# Patient Record
Sex: Female | Born: 1970 | Race: White | Hispanic: No | Marital: Single
Health system: Western US, Academic
[De-identification: ages and names within clinical notes are randomized; demographics above are authoritative.]

## PROBLEM LIST (undated history)

## (undated) DIAGNOSIS — Z973 Presence of spectacles and contact lenses: Secondary | ICD-10-CM

## (undated) DIAGNOSIS — B379 Candidiasis, unspecified: Secondary | ICD-10-CM

## (undated) HISTORY — PX: HX GALL BLADDER SURGERY/CHOLE: SHX55

## (undated) HISTORY — PX: HX WISDOM TEETH EXTRACTION: SHX21

---

## 2020-04-21 ENCOUNTER — Telehealth (HOSPITAL_BASED_OUTPATIENT_CLINIC_OR_DEPARTMENT_OTHER): Payer: Self-pay

## 2020-04-21 NOTE — Telephone Encounter (Signed)
[facility] Call Center - External Referral - New Patient Appointment Request:    Incoming call received from: Patient      Patient is being referred to: GYN Onc / Dr. Lajoyce Corners     Dx:     Referred by: Self Referral     Referring MD Phone:     Registration Checklist Completed: Yes     Advised to have medical records faxed to: [phone] / U/S report received on Friday 10/1     Best callback number: [phone]    Ok to leave a detailed message: Yes     Pt only faxed over u/s results. Per pt has no Dx, has not had a Bx. Pt has no other records. Please follow up. Thank you.     Inform caller:    "Once the referral has been triaged by our clinical team, the provider's administrative assistant will contact the patient to offer an appointment."    "The Administrative Assistant will make a follow-up call if additional records, referral, or authorization are needed."    "The expected turnaround time is: 1-3 business days."

## 2020-04-23 NOTE — Telephone Encounter (Signed)
Looking to establish care with general gynecology referred to [facility] for assistance.

## 2020-04-23 NOTE — Telephone Encounter (Signed)
Out going placed, left message need all supporting medical records to review.

## 2020-04-26 ENCOUNTER — Telehealth (INDEPENDENT_AMBULATORY_CARE_PROVIDER_SITE_OTHER): Payer: Self-pay | Admitting: Ob/Gyn

## 2020-04-26 NOTE — Telephone Encounter (Signed)
I do not see her on my schedule.    2 no shows and she is excused from the practice, as she is not an established patient.    She lost the opportunity.  Other people are waiting in line.

## 2020-04-26 NOTE — Telephone Encounter (Addendum)
Sorry Dr. Kristopher Glee this was sent in error

## 2020-04-27 NOTE — Telephone Encounter (Signed)
I called and LMOm for Savvy Peeters to call and schedule.

## 2020-06-02 ENCOUNTER — Ambulatory Visit (HOSPITAL_COMMUNITY)
Admission: RE | Admit: 2020-06-02 | Discharge: 2020-06-02 | Disposition: A | Discharge status: Home / Self Care | Payer: Self-pay | Source: Ambulatory Visit

## 2022-09-18 ENCOUNTER — Encounter (HOSPITAL_BASED_OUTPATIENT_CLINIC_OR_DEPARTMENT_OTHER): Payer: Self-pay

## 2022-09-18 ENCOUNTER — Emergency Department
Admission: EM | Admit: 2022-09-18 | Discharge: 2022-09-18 | Disposition: A | Discharge status: Home / Self Care | Payer: BLUE CROSS/BLUE SHIELD | Attending: Student in an Organized Health Care Education/Training Program | Admitting: Student in an Organized Health Care Education/Training Program

## 2022-09-18 ENCOUNTER — Other Ambulatory Visit: Payer: Self-pay

## 2022-09-18 DIAGNOSIS — R300 Dysuria: Secondary | ICD-10-CM | POA: Insufficient documentation

## 2022-09-18 DIAGNOSIS — R1030 Lower abdominal pain, unspecified: Secondary | ICD-10-CM

## 2022-09-18 HISTORY — DX: Presence of spectacles and contact lenses: Z97.3

## 2022-09-18 LAB — CBC WITH DIFF
BASOPHIL #: 0.06 10*3/uL (ref 0.00–0.30)
BASOPHIL %: 1 % (ref 0–3)
EOSINOPHIL #: 0.32 10*3/uL (ref 0.00–0.80)
EOSINOPHIL %: 3 % (ref 0–7)
HCT: 43 % (ref 37.0–47.0)
HGB: 15.4 g/dL (ref 12.5–16.0)
LYMPHOCYTE #: 3.64 10*3/uL (ref 1.10–5.00)
LYMPHOCYTE %: 33 % (ref 25–45)
MCH: 33.4 pg — ABNORMAL HIGH (ref 27.0–32.0)
MCHC: 35.8 g/dL (ref 32.0–36.0)
MCV: 93.3 fL (ref 78.0–99.0)
MONOCYTE #: 0.65 10*3/uL (ref 0.00–1.30)
MONOCYTE %: 6 % (ref 0–12)
MPV: 8.8 fL (ref 7.4–10.4)
NEUTROPHIL #: 6.36 10*3/uL (ref 1.80–8.40)
NEUTROPHIL %: 58 % (ref 40–76)
PLATELETS: 228 10*3/uL (ref 140–440)
RBC: 4.61 10*6/uL (ref 4.20–5.40)
RDW: 14.7 % (ref 11.6–14.8)
WBC: 11 10*3/uL — ABNORMAL HIGH (ref 4.0–10.5)

## 2022-09-18 LAB — URINALYSIS, MICROSCOPIC: BACTERIA: NEGATIVE /hpf

## 2022-09-18 LAB — URINALYSIS, MACRO/MICRO
BILIRUBIN: NEGATIVE mg/dL
GLUCOSE: NEGATIVE mg/dL
KETONES: NEGATIVE mg/dL
LEUKOCYTES: NEGATIVE WBCs/uL
NITRITE: NEGATIVE
PH: 6 (ref 4.6–8.0)
PROTEIN: NEGATIVE mg/dL
SPECIFIC GRAVITY: 1.01 (ref 1.003–1.035)
UROBILINOGEN: 0.2 mg/dL (ref 0.2–1.0)

## 2022-09-18 LAB — BASIC METABOLIC PANEL
ANION GAP: 7 mmol/L (ref 4–13)
BUN/CREA RATIO: 14
BUN: 12 mg/dL (ref 7–18)
CALCIUM: 9.3 mg/dL (ref 8.5–10.1)
CHLORIDE: 105 mmol/L (ref 98–107)
CO2 TOTAL: 27 mmol/L (ref 21–32)
CREATININE: 0.87 mg/dL (ref 0.55–1.02)
ESTIMATED GFR: 81 mL/min/{1.73_m2} (ref >59)
GLUCOSE: 88 mg/dL (ref 74–106)
OSMOLALITY, CALCULATED: 277 mOsm/kg (ref 270–290)
POTASSIUM: 3.9 mmol/L (ref 3.5–5.1)
SODIUM: 139 mmol/L (ref 136–145)

## 2022-09-18 NOTE — Discharge Instructions (Signed)
Please check your labs on MyChart. You will be informed of any concerning results. Follow with you primary care and gynecology idally within the next 2-3 days. Return to the emergency department if you what uncontrollable fevers, back pain, or any other concerning symptoms.

## 2022-09-18 NOTE — ED Triage Notes (Signed)
Patient states started 8 days ago she went to [place] on Thursday and was told if it did not get better to come here.  Lower abd pain.

## 2022-09-18 NOTE — ED Provider Notes (Signed)
[facility], [employer] Emergency Department    Name: Jocelyn Le  Age and Gender: 52 y.o. female  PCP: No Pcp    Triage Note:   Abdominal Pain      HPI:  Jocelyn Le is a 52 y.o. female  who presents to the ED today for abdominal pain, urinary discomfort.  Reports about 1 week of worsening burning discomfort with urination as well as burning lower abdominal pain. No associated fever or chills. No nausea or vomiting, no diarrhea. Has not had any CP or SOB. Patient is still eating/drinking. Reports that she has been perimenopausal for several months, most recent period was back in the beginning of February. Has not followed with Ob/Gyn recently. No recent sexual activity.     History Limitations: None         ROS:  Pertinent ROS noted within HPI.      PHYSICAL EXAM:  Objective:  Nursing notes reviewed  ED Triage Vitals [09/18/22 1802]   BP (Non-Invasive) (!) 159/86   Heart Rate 72   Respiratory Rate 18   Temperature 37.3 C (99.1 F)   SpO2 100 %   Weight 81.6 kg (180 lb)   Height 1.651 m ('5\' 5"'$ )     Filed Vitals:    09/18/22 1802   BP: (!) 159/86   Pulse: 72   Resp: 18   Temp: 37.3 C (99.1 F)   SpO2: 100%       Pertinent Physical Exam:  Constitutional: 52 y.o. female appears stated age in good health. Sitting up in bed in no acute distress.  HENT: Normocephalic, atraumatic.  Neck: Supple, trachea midline.  Cardiovascular: Regular rate and regular rhythm.   Pulmonary/Chest: Normal work of breathing, no distress.   Abdominal: Soft, non-distended. Non-tender to palpation. No rebound or guarding.  GU: Deferred  Musculoskeletal: Normal ROM. No lower extremity edema.  Skin: Warm and dry. No rashes, erythema or cyanosis.  Psychiatric: Normal mood and affect.  Neurological: Alert and oriented. Conversing appropriately. Moving all extremities equally and fully with normal ROM. Sensation intact.    MEDICAL DECISION MAKING    Course and MDM:  Patient seen and examined. Labs and imaging  reviewed.  Jocelyn Le is a 51 y.o. female presenting for urinary discomfort, burning abd pain.    Reports 1 weeks of burning discomfort with urination and lower abd pain. States perimenopausal, most recent period beginning of February. Not sexually active recently. No lesions, rashes or other complaints.  On arrival, non-toxic appearing. Exam largely benign. GU exam initially deferred. UA, basic labs ordered.  Pending above, will transfer care to oncoming physician at this time.    Medical Decision Making  Amount and/or Complexity of Data Reviewed  Labs: ordered.            Disposition: Likely discharge      Clinical Impression:     Clinical Impression   Dysuria (Primary)            /Francess Mullen "Lorri Frederick, MD 09/18/2022, 18:44   Department of Emergency Medicine  [facility]      *Parts of this patients chart were completed in a retrospective fashion due to simultaneous direct patient care activities in the Emergency Department.   *This note was partially generated using MModal Fluency Direct system, and there may be some incorrect words, spellings, and punctuation that were not noted in checking the note before saving.

## 2022-09-18 NOTE — ED Attending Handoff Note (Signed)
MDM:    ED Course as of 09/18/22 2018   Mon Sep 18, 2022   2017 Patient room on my re-evaluation.  She would like to leave without labs.  He states that she urinated and had a bit of blood in it he thinks she might be starting her menstruation.  She also reports that she has been self medicating with clindamycin for 5 days.  As a shared decision patient will follow with primary care and will further gynecology.  Will inform her of any concerning labs.  I gave strict return to ED instructions both verbal and written manner      Discharged  Clinical Impression   Dysuria (Primary)           Current Discharge Medication List        CONTINUE these medications - NO CHANGES were made during your visit.        Details   cholecalciferol (vitamin D3) 250 mcg (10,000 unit) Capsule   2,000 Units, Oral, DAILY  Refills: 0     multivitamin with iron Tablet   1 Tablet, Oral, DAILY  Refills: 0

## 2022-09-18 NOTE — ED Nurses Note (Signed)
Pt not in room at time of discharge.

## 2022-09-27 ENCOUNTER — Emergency Department
Admission: EM | Admit: 2022-09-27 | Discharge: 2022-09-27 | Disposition: A | Discharge status: Home / Self Care | Payer: BLUE CROSS/BLUE SHIELD | Attending: Physician Assistant | Admitting: Physician Assistant

## 2022-09-27 ENCOUNTER — Encounter (HOSPITAL_BASED_OUTPATIENT_CLINIC_OR_DEPARTMENT_OTHER): Payer: Self-pay

## 2022-09-27 ENCOUNTER — Other Ambulatory Visit: Payer: Self-pay

## 2022-09-27 DIAGNOSIS — R509 Fever, unspecified: Secondary | ICD-10-CM | POA: Insufficient documentation

## 2022-09-27 DIAGNOSIS — Z3200 Encounter for pregnancy test, result unknown: Secondary | ICD-10-CM | POA: Insufficient documentation

## 2022-09-27 DIAGNOSIS — R0982 Postnasal drip: Secondary | ICD-10-CM | POA: Insufficient documentation

## 2022-09-27 DIAGNOSIS — R682 Dry mouth, unspecified: Secondary | ICD-10-CM | POA: Insufficient documentation

## 2022-09-27 DIAGNOSIS — R102 Pelvic and perineal pain: Secondary | ICD-10-CM

## 2022-09-27 DIAGNOSIS — Z1152 Encounter for screening for COVID-19: Secondary | ICD-10-CM | POA: Insufficient documentation

## 2022-09-27 LAB — CBC WITH DIFF
BASOPHIL #: 0.04 10*3/uL (ref 0.00–0.30)
BASOPHIL %: 0 % (ref 0–3)
EOSINOPHIL #: 0.24 10*3/uL (ref 0.00–0.80)
EOSINOPHIL %: 2 % (ref 0–7)
HCT: 46 % (ref 37.0–47.0)
HGB: 15.7 g/dL (ref 12.5–16.0)
LYMPHOCYTE #: 2.86 10*3/uL (ref 1.10–5.00)
LYMPHOCYTE %: 25 % (ref 25–45)
MCH: 32.1 pg — ABNORMAL HIGH (ref 27.0–32.0)
MCHC: 34 g/dL (ref 32.0–36.0)
MCV: 94.2 fL (ref 78.0–99.0)
MONOCYTE #: 0.61 10*3/uL (ref 0.00–1.30)
MONOCYTE %: 5 % (ref 0–12)
MPV: 8.3 fL (ref 7.4–10.4)
NEUTROPHIL #: 7.8 10*3/uL (ref 1.80–8.40)
NEUTROPHIL %: 68 % (ref 40–76)
PLATELETS: 197 10*3/uL (ref 140–440)
RBC: 4.88 10*6/uL (ref 4.20–5.40)
RDW: 14.8 % (ref 11.6–14.8)
WBC: 11.6 10*3/uL — ABNORMAL HIGH (ref 4.0–10.5)

## 2022-09-27 LAB — COMPREHENSIVE METABOLIC PANEL, NON-FASTING
ALBUMIN/GLOBULIN RATIO: 1 (ref 0.8–1.4)
ALBUMIN: 3.7 g/dL (ref 3.4–5.0)
ALKALINE PHOSPHATASE: 64 U/L (ref 46–116)
ALT (SGPT): 16 U/L (ref <=78)
ANION GAP: 10 mmol/L (ref 4–13)
AST (SGOT): 20 U/L (ref 15–37)
BILIRUBIN TOTAL: 0.8 mg/dL (ref 0.2–1.0)
BUN/CREA RATIO: 15
BUN: 12 mg/dL (ref 7–18)
CALCIUM, CORRECTED: 9.4 mg/dL
CALCIUM: 9.2 mg/dL (ref 8.5–10.1)
CHLORIDE: 104 mmol/L (ref 98–107)
CO2 TOTAL: 25 mmol/L (ref 21–32)
CREATININE: 0.81 mg/dL (ref 0.55–1.02)
ESTIMATED GFR: 88 mL/min/{1.73_m2} (ref >59)
GLOBULIN: 3.6
GLUCOSE: 87 mg/dL (ref 74–106)
OSMOLALITY, CALCULATED: 277 mOsm/kg (ref 270–290)
POTASSIUM: 4.1 mmol/L (ref 3.5–5.1)
PROTEIN TOTAL: 7.3 g/dL (ref 6.4–8.2)
SODIUM: 139 mmol/L (ref 136–145)

## 2022-09-27 LAB — URINALYSIS, MACRO/MICRO
BILIRUBIN: NEGATIVE mg/dL
BLOOD: NEGATIVE mg/dL
GLUCOSE: NEGATIVE mg/dL
KETONES: NEGATIVE mg/dL
LEUKOCYTES: NEGATIVE WBCs/uL
NITRITE: NEGATIVE
PH: 7 (ref 4.6–8.0)
PROTEIN: NEGATIVE mg/dL
SPECIFIC GRAVITY: 1.015 (ref 1.003–1.035)
UROBILINOGEN: 0.2 mg/dL (ref 0.2–1.0)

## 2022-09-27 LAB — COVID-19, FLU A/B, RSV RAPID BY PCR
INFLUENZA VIRUS TYPE A: NOT DETECTED
INFLUENZA VIRUS TYPE B: NOT DETECTED
RESPIRATORY SYNCTIAL VIRUS (RSV): NOT DETECTED
SARS-CoV-2: NOT DETECTED

## 2022-09-27 LAB — WETMOUNT
CLUE CELLS: ABSENT
TRICHOMONAS: ABSENT
YEAST: ABSENT

## 2022-09-27 LAB — LACTIC ACID LEVEL W/ REFLEX FOR LEVEL >2.0: LACTIC ACID: 0.7 mmol/L (ref 0.4–2.0)

## 2022-09-27 LAB — HCG QUALITATIVE PREGNANCY, SERUM: PREGNANCY, SERUM QUALITATIVE: NEGATIVE

## 2022-09-27 NOTE — ED Provider Notes (Signed)
Emergency Medicine      Name: Jocelyn Le  Age and Gender: 52 y.o. female  Date of Birth: [birth year]  MRN: [id number]  PCP: No Pcp    CC:  Chief Complaint   Patient presents with    Abdominal Pain       HPI:  Jocelyn Le is a 52 y.o. White female who presents to the ER with vaginal and pelvic burning for the past 3 weeks. She says she has been having associated lower abdominal discomfort. She says she had a full course of Clindamycin at her home which she took in attempt to relieve her symptoms, which did not help. She then reportedly tried a 3 day course of monistat, which did not change her symptoms but made her urine "bubbly". She denies sexual activity since 2018. She also notes dry mouth and feels her cervical lymph nodes are swollen, and endorses chronic postnasal drainage. Patient states she is perimenopausal and had a prolonged menses last month. She was seen in the ER last Monday but started having some vaginal spotting and so assumed that was what was causing her symptoms and was discharged. She returned to the ER today after she developed a fever of 99 at home this morning. She did make an appointment with Dr Theodosia Quay, which isn't until next month.    Below pertinent information reviewed with patient:  Past Medical History:   Diagnosis Date    Wears glasses          No Known Allergies    Past Surgical History:   Procedure Laterality Date    HX CHOLECYSTECTOMY            Social History     Socioeconomic History    Marital status: Single   Tobacco Use    Smoking status: Former     Types: Cigarettes    Smokeless tobacco: Never   Vaping Use    Vaping status: Never Used   Substance and Sexual Activity    Alcohol use: Yes     Comment: rarely    Drug use: Never       ROS:  No other overt positive review of systems are noted other than stated in the HPI.      Objective:    ED Triage Vitals [09/27/22 1448]   BP (Non-Invasive) (!) 163/89   Heart Rate 82   Respiratory Rate 17   Temperature 37.8 C (100.1 F)    SpO2 99 %   Weight 81.6 kg (180 lb)   Height 1.651 m ('5\' 5"'$ )     Filed Vitals:    09/27/22 1448 09/27/22 1717   BP: (!) 163/89 137/86   Pulse: 82 77   Resp: 17 14   Temp: 37.8 C (100.1 F) 36.9 C (98.5 F)   SpO2: 99% 99%       Nursing notes and vital signs reviewed.    Constitutional - No acute distress.  Alert and Active.  HEENT - Normocephalic. Conjunctiva clear. Moist mucous membranes.   Neck - Trachea midline. No stridor. No hoarseness.  Cardiac - Regular rate and rhythm. No murmurs, rubs, or gallops.   Respiratory/Chest - Normal respiratory effort. Clear to auscultation bilaterally. No rales, wheezes or rhonchi.   Abdomen - Normal bowel sounds. Suprapubic tenderness, soft, non-distended. No rebound or guarding. No CVA tenderness.  GU - normal external appearance of the vagina, with a small amount of thin white vaginal discharge. No CMT or tenderness of the adnexa.  Musculoskeletal -  Good AROM. No clubbing, cyanosis or edema.  Skin - Warm and dry, without any rashes or other lesions.  Neuro - Alert and oriented x 3. Moving all extremities symmetrically.   Psych - Normal mood and affect. Behavior is normal       Any pertinent labs and imaging obtained during this encounter reviewed below in MDM.    MDM/ED Course:      Medical Decision Making  Patient presented to the ER with complaints of vaginal pain and burning as well as lower abdominal discomfort which has been present for the past 3 weeks.  Patient reported that she is perimenopausal with last menstrual cycle in February as well as a small amount of vaginal spotting last Monday.  She was seen in the ER last Monday, was evaluated and discharged with instructions to follow up with Gynecology.  She has an upcoming appointment with Dr. Theodosia Quay next month.  She says she developed a subjective fever over the weekend with temp up to 99 at home this morning so she came back to the ER for evaluation.  Differential diagnoses include UTI, PID, acute viral  syndrome.  Patient underwent diagnostics with results as noted in ED course.  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  Although the patient is requesting antibiotics, I do not feel they are currently indicated and may cause additional difficulty in determining the clear etiology of the patient's symptoms.  For this reason, I will discharge her in stable condition with instructions to take Tylenol/Motrin as needed for pain or fever.  Patient will be given additional referrals for gynecology.    Amount and/or Complexity of Data Reviewed  Labs: ordered. Decision-making details documented in ED Course.  ECG/medicine tests: independent interpretation performed.        ED Course as of 09/27/22 1818   Wed Sep 27, 2022   1719 COMPREHENSIVE METABOLIC PANEL, NON-FASTING  normal   1719 LACTIC ACID: 0.7   1719 PREGNANCY, SERUM QUALITATIVE: Negative   1719 CBC/DIFF(!)  Wbc 11.6, similar to labs from last week, normal H&H, plts and PMNs   1720 SARS CORONAVIRUS 2 (SARS-CoV-2): Not Detected   1720 INFLUENZA VIRUS TYPE A: Not Detected   1720 INFLUENZA VIRUS TYPE B: Not Detected   1720 RSV PCR: Not Detected   1720 URINALYSIS WITH REFLEX MICROSCOPIC AND CULTURE IF POSITIVE(!)  normal   1722 TRICHOMONAS: Absent   1722 YEAST: Absent   1722 CLUE CELLS: Absent       Orders Placed This Encounter    [facility] BLOOD CULTURE, SET OF 2 ADULT BOTTLES (BACTERIA AND YEAST)    ADULT ROUTINE BLOOD CULTURE, SET OF 2 ADULT BOTTLES (BACTERIA AND YEAST)    URINE CULTURE    URINALYSIS WITH REFLEX MICROSCOPIC AND CULTURE IF POSITIVE    URINALYSIS, MACRO/MICRO    CBC/DIFF    COMPREHENSIVE METABOLIC PANEL, NON-FASTING    LACTIC ACID LEVEL W/ REFLEX FOR LEVEL >2.0    HCG QUALITATIVE PREGNANCY, SERUM    CHLAMYDIA / NEISSERIA DNA BY PCR    COVID-19, FLU A/B, RSV RAPID BY PCR    CBC WITH DIFF         Impression:   Clinical Impression   Vulvovaginal pain (Primary)       Disposition: Discharged    / Elige Ko PA-C   09/27/2022, 15:53  [facility]  Department of Emergency Medicine  [facility]    Portions of this  note may have been dictated using voice recognition software.     -----------------------  Results for orders placed or performed during the hospital encounter of 09/27/22 (from the past 12 hour(s))   URINALYSIS, MACRO/MICRO   Result Value Ref Range    COLOR Light Yellow (A) Yellow    APPEARANCE Clear Clear    SPECIFIC GRAVITY 1.015 1.003 - 1.035    PH 7.0 4.6 - 8.0    LEUKOCYTES Negative Negative WBCs/uL    NITRITE Negative Negative    PROTEIN Negative Negative mg/dL    GLUCOSE Negative Negative mg/dL    KETONES Negative Negative mg/dL    BILIRUBIN Negative Negative mg/dL    BLOOD Negative Negative mg/dL    UROBILINOGEN 0.2 0.2 - 1.0 mg/dL   COVID-19, FLU A/B, RSV RAPID BY PCR   Result Value Ref Range    SARS-CoV-2 Not Detected Not Detected    INFLUENZA VIRUS TYPE A Not Detected Not Detected    INFLUENZA VIRUS TYPE B Not Detected Not Detected    RESPIRATORY SYNCTIAL VIRUS (RSV) Not Detected Not Detected   COMPREHENSIVE METABOLIC PANEL, NON-FASTING   Result Value Ref Range    SODIUM 139 136 - 145 mmol/L    POTASSIUM 4.1 3.5 - 5.1 mmol/L    CHLORIDE 104 98 - 107 mmol/L    CO2 TOTAL 25 21 - 32 mmol/L    ANION GAP 10 4 - 13 mmol/L    BUN 12 7 - 18 mg/dL    CREATININE 0.81 0.55 - 1.02 mg/dL    BUN/CREA RATIO 15     ESTIMATED GFR 88 >59 mL/min/1.9m2    ALBUMIN 3.7 3.4 - 5.0 g/dL    CALCIUM 9.2 8.5 - 10.1 mg/dL    GLUCOSE 87 74 - 106 mg/dL    ALKALINE PHOSPHATASE 64 46 - 116 U/L    ALT (SGPT) 16 <=78 U/L    AST (SGOT) 20 15 - 37 U/L    BILIRUBIN TOTAL 0.8 0.2 - 1.0 mg/dL    PROTEIN TOTAL 7.3 6.4 - 8.2 g/dL    ALBUMIN/GLOBULIN RATIO 1.0 0.8 - 1.4    OSMOLALITY, CALCULATED 277 270 - 290 mOsm/kg    CALCIUM, CORRECTED 9.4 mg/dL    GLOBULIN 3.6    LACTIC ACID LEVEL W/ REFLEX FOR LEVEL >2.0   Result Value Ref Range    LACTIC ACID 0.7 0.4 - 2.0 mmol/L   HCG QUALITATIVE PREGNANCY, SERUM   Result Value  Ref Range    PREGNANCY, SERUM QUALITATIVE Negative Negative, Indeterminate   CBC WITH DIFF   Result Value Ref Range    WBC 11.6 (H) 4.0 - 10.5 x10^3/uL    RBC 4.88 4.20 - 5.40 x10^6/uL    HGB 15.7 12.5 - 16.0 g/dL    HCT 46.0 37.0 - 47.0 %    MCV 94.2 78.0 - 99.0 fL    MCH 32.1 (H) 27.0 - 32.0 pg    MCHC 34.0 32.0 - 36.0 g/dL    RDW 14.8 11.6 - 14.8 %    PLATELETS 197 140 - 440 x10^3/uL    MPV 8.3 7.4 - 10.4 fL    NEUTROPHIL % 68 40 - 76 %    LYMPHOCYTE % 25 25 - 45 %    MONOCYTE % 5 0 - 12 %    EOSINOPHIL % 2 0 - 7 %    BASOPHIL % 0 0 - 3 %    NEUTROPHIL # 7.80 1.80 - 8.40 x10^3/uL    LYMPHOCYTE #  2.86 1.10 - 5.00 x10^3/uL    MONOCYTE # 0.61 0.00 - 1.30 x10^3/uL    EOSINOPHIL # 0.24 0.00 - 0.80 x10^3/uL    BASOPHIL # 0.04 0.00 - 0.30 x10^3/uL   WETMOUNT    Specimen: Vaginal; Other   Result Value Ref Range    TRICHOMONAS Absent Absent    YEAST Absent Absent    CLUE CELLS Absent Absent     No orders to display

## 2022-09-27 NOTE — ED Triage Notes (Signed)
Patient states started about 3 weeks ago lower abd pain.  Was here on the 4th and called GYN for appointment but cannot get in for month.  Lower abd pain burning, fever, swollen lymph nodes, and bubbly urine

## 2022-09-27 NOTE — ED Nurses Note (Signed)
Pt refused food tray that was brought to her, she kept water

## 2022-09-27 NOTE — Discharge Instructions (Signed)
Follow-up with the gyn of your choice for further evaluation and treatment.  Return to the ER for other emergencies or as needed

## 2022-09-27 NOTE — ED Nurses Note (Signed)
Patient discharged home with family.  AVS reviewed with patient/care giver.  A written copy of the AVS and discharge instructions was given to the patient/care giver.  Questions sufficiently answered as needed.  Patient/care giver encouraged to follow up with PCP as indicated.  In the event of an emergency, patient/care giver instructed to call 911 or go to the nearest emergency room.

## 2022-09-27 NOTE — ED Nurses Note (Signed)
Vaginal exam by K harmon

## 2022-09-28 LAB — CHLAMYDIA / NEISSERIA DNA BY PCR
CHLAMYDIA TRACHOMATIS PCR: NOT DETECTED
NEISSERIA GONORRHOEAE PCR: NOT DETECTED

## 2022-10-02 LAB — ADULT ROUTINE BLOOD CULTURE, SET OF 2 BOTTLES (BACTERIA AND YEAST)
BLOOD CULTURE, ROUTINE: NO GROWTH
BLOOD CULTURE, ROUTINE: NO GROWTH

## 2022-10-23 DIAGNOSIS — B3731 Acute candidiasis of vulva and vagina: Secondary | ICD-10-CM | POA: Insufficient documentation

## 2022-10-24 ENCOUNTER — Emergency Department (EMERGENCY_DEPARTMENT_HOSPITAL): Payer: BLUE CROSS/BLUE SHIELD

## 2022-10-24 ENCOUNTER — Emergency Department
Admission: EM | Admit: 2022-10-24 | Discharge: 2022-10-24 | Disposition: A | Discharge status: Home / Self Care | Payer: BLUE CROSS/BLUE SHIELD | Attending: Emergency Medicine | Admitting: Emergency Medicine

## 2022-10-24 ENCOUNTER — Encounter (HOSPITAL_BASED_OUTPATIENT_CLINIC_OR_DEPARTMENT_OTHER): Payer: Self-pay

## 2022-10-24 ENCOUNTER — Other Ambulatory Visit: Payer: Self-pay

## 2022-10-24 DIAGNOSIS — T378X5A Adverse effect of other specified systemic anti-infectives and antiparasitics, initial encounter: Secondary | ICD-10-CM | POA: Insufficient documentation

## 2022-10-24 DIAGNOSIS — I7090 Unspecified atherosclerosis: Secondary | ICD-10-CM | POA: Insufficient documentation

## 2022-10-24 DIAGNOSIS — Z87891 Personal history of nicotine dependence: Secondary | ICD-10-CM | POA: Insufficient documentation

## 2022-10-24 DIAGNOSIS — R531 Weakness: Secondary | ICD-10-CM

## 2022-10-24 DIAGNOSIS — T50905A Adverse effect of unspecified drugs, medicaments and biological substances, initial encounter: Secondary | ICD-10-CM

## 2022-10-24 DIAGNOSIS — I6523 Occlusion and stenosis of bilateral carotid arteries: Secondary | ICD-10-CM

## 2022-10-24 DIAGNOSIS — R202 Paresthesia of skin: Secondary | ICD-10-CM | POA: Insufficient documentation

## 2022-10-24 DIAGNOSIS — I4510 Unspecified right bundle-branch block: Secondary | ICD-10-CM | POA: Insufficient documentation

## 2022-10-24 LAB — COMPREHENSIVE METABOLIC PANEL, NON-FASTING
ALBUMIN/GLOBULIN RATIO: 1.1 (ref 0.8–1.4)
ALBUMIN: 4 g/dL (ref 3.4–5.0)
ALKALINE PHOSPHATASE: 63 U/L (ref 46–116)
ALT (SGPT): 12 U/L (ref <=78)
ANION GAP: 11 mmol/L (ref 4–13)
AST (SGOT): 15 U/L (ref 15–37)
BILIRUBIN TOTAL: 0.9 mg/dL (ref 0.2–1.0)
BUN/CREA RATIO: 15
BUN: 16 mg/dL (ref 7–18)
CALCIUM, CORRECTED: 9 mg/dL
CALCIUM: 9 mg/dL (ref 8.5–10.1)
CHLORIDE: 103 mmol/L (ref 98–107)
CO2 TOTAL: 26 mmol/L (ref 21–32)
CREATININE: 1.05 mg/dL — ABNORMAL HIGH (ref 0.55–1.02)
ESTIMATED GFR: 65 mL/min/{1.73_m2} (ref >59)
GLOBULIN: 3.8
GLUCOSE: 105 mg/dL (ref 74–106)
OSMOLALITY, CALCULATED: 281 mOsm/kg (ref 270–290)
POTASSIUM: 4.5 mmol/L (ref 3.5–5.1)
PROTEIN TOTAL: 7.8 g/dL (ref 6.4–8.2)
SODIUM: 140 mmol/L (ref 136–145)

## 2022-10-24 LAB — CBC WITH DIFF
BASOPHIL #: 0.04 10*3/uL (ref 0.00–0.30)
BASOPHIL %: 0 % (ref 0–3)
EOSINOPHIL #: 0.16 10*3/uL (ref 0.00–0.80)
EOSINOPHIL %: 2 % (ref 0–7)
HCT: 49 % — ABNORMAL HIGH (ref 37.0–47.0)
HGB: 16.9 g/dL — ABNORMAL HIGH (ref 12.5–16.0)
LYMPHOCYTE #: 2.32 10*3/uL (ref 1.10–5.00)
LYMPHOCYTE %: 23 % — ABNORMAL LOW (ref 25–45)
MCH: 32.3 pg — ABNORMAL HIGH (ref 27.0–32.0)
MCHC: 34.5 g/dL (ref 32.0–36.0)
MCV: 93.6 fL (ref 78.0–99.0)
MONOCYTE #: 0.44 10*3/uL (ref 0.00–1.30)
MONOCYTE %: 4 % (ref 0–12)
MPV: 8 fL (ref 7.4–10.4)
NEUTROPHIL #: 7.03 10*3/uL (ref 1.80–8.40)
NEUTROPHIL %: 70 % (ref 40–76)
PLATELETS: 199 10*3/uL (ref 140–440)
RBC: 5.24 10*6/uL (ref 4.20–5.40)
RDW: 14.6 % (ref 11.6–14.8)
WBC: 10 10*3/uL (ref 4.0–10.5)

## 2022-10-24 LAB — NT-PROBNP: NT-PROBNP: 67 pg/mL (ref <=125)

## 2022-10-24 LAB — POC BLOOD GLUCOSE (RESULTS): GLUCOSE, POC: 107 mg/dl — ABNORMAL HIGH (ref 70–100)

## 2022-10-24 LAB — PT/INR
INR: 1.06 (ref 0.88–1.10)
PROTHROMBIN TIME: 12.3 seconds (ref 9.8–12.7)

## 2022-10-24 LAB — TROPONIN-I: TROPONIN I: 9 ng/L (ref <15)

## 2022-10-24 LAB — PTT (PARTIAL THROMBOPLASTIN TIME): APTT: 37.7 seconds — ABNORMAL HIGH (ref 22.0–31.7)

## 2022-10-24 MED ORDER — IOHEXOL 350 MG IODINE/ML INTRAVENOUS SOLUTION
100.0000 mL | INTRAVENOUS | Status: AC
Start: 2022-10-24 — End: 2022-10-24
  Administered 2022-10-24: 100 mL via INTRAVENOUS

## 2022-10-24 NOTE — Discharge Instructions (Signed)
See your primary care doctor were discussed the case with them in the next week

## 2022-10-24 NOTE — ED Nurses Note (Signed)
Pt states, "I was at work typing when my I started feeling funny.  My head felt fuzzy. My left arm became heavy and tingling . Then my right arm started. Last night I had a head ache, but I didn't this morning. I recently started Taking Flagyl for a yeast infection. I have mold in my house and that makes the yeast worse. I'm afraid the mold may have went to my brain." Speech clear. Moves all extremities at will and to command. No weakness noted. Pupils equal and react brisk 3.0 mm. Nurse remains with pt until no longer crying.

## 2022-10-24 NOTE — ED Triage Notes (Signed)
Patient states started about 20 min ago dizzy and arms feel heavy the left more than right.  Head is tingly.

## 2022-10-24 NOTE — ED Provider Notes (Signed)
CHIEF COMPLAINT  Chief Complaint   Patient presents with    Dizziness     HISTORY OF PRESENT ILLNESS  Jocelyn Le, date of birth 1970/09/25, is a 52 y.o. female who presented to the Emergency Department    Patient states that approximately 9:15 a.m. she was sitting at her desk at onset of upper extremity weakness having his paresthesias left more so than right she also felt a sensation in her head like her brain shift different 1 side to the other.  She has not had sensation like this in the past.  She is concerned that she has a yeast infection her urine as per spread to the rest of her body.  These infection was diagnosed recently by OBGYN she is just started fluconazole yesterday for this infection.  Le chest pain nausea or vomiting.  Le lower extremity weakness Le fever chills    PAST MEDICAL/SURGICAL/FAMILY/SOCIAL HISTORY  Past Medical History:   Diagnosis Date    Wears glasses        Past Surgical History:   Procedure Laterality Date    HX CHOLECYSTECTOMY         Family Medical History:    None       Social History     Socioeconomic History    Marital status: Single   Tobacco Use    Smoking status: Former     Types: Cigarettes    Smokeless tobacco: Never   Vaping Use    Vaping status: Never Used   Substance and Sexual Activity    Alcohol use: Not Currently     Comment: rarely    Drug use: Never      ALLERGIES  Allergies   Allergen Reactions    Smallpox Vaccine,Chick-Embryo,Live  Other Adverse Reaction (Add comment)     Patient unsure which vaccine it was and does not know what happened.  States her mom said she about died.       PHYSICAL EXAM  VITAL SIGNS:  Filed Vitals:    10/24/22 0954   BP: (!) 163/95   Pulse: 80   Resp: 17   Temp: 37 C (98.6 F)   SpO2: 100%     GENERAL: PATIENT IS ALERT AND ORIENTED TO PERSON, PLACE, AND TIME.  IN Le DISTRESS  HEAD: NORMOCEPHALIC AND ATRAUMATIC.  EYES: PUPILS EQUALLY ROUND AND REACT TO LIGHT. EXTRAOCULAR MOVEMENTS INTACT.  EARS: GROSS HEARING INTACT. EXTERNAL  EARS WITHIN NORMAL LIMITS.  THROAT: MOIST ORAL MUCOSA. Le ERYTHEMA OR EXUDATE OF THE PHARYNX.  NECK: SUPPLE. TRACHEA MIDLINE.  Le LYMPHADENOPATHY  CARDIOVASCULAR: REGULAR, RATE, AND RHYTHM. Le MURMUR.  LUNGS: CLEAR TO AUSCULTATION BILATERAL.  ABDOMEN: SOFT, NON-TENDER, NON-DISTENDED, AND BOWEL SOUNDS ARE PRESENT.  GENITOURINARY: DEFERRED.  RECTAL: DEFERRED.  EXTREMITIES: Le CYANOSIS, CLUBBING, OR EDEMA.  Le GROSS DEFORMITIES, MOVES ALL 4 EXTREMITIES  SKIN: WARM AND DRY.  NEUROLOGIC: CRANIAL NERVES II THROUGH XII ARE GROSSLY INTACT ALTHOUGH NOT INDIVIDUALLY TESTED.  Le gross motor deficits  PSYCHIATRIC: JUDGMENT AND INSIGHT ARE SEEMINGLY INTACT. MOOD AND AFFECT ARE APPROPRIATE FOR THE SITUATION.    NIH Stroke Scale:  NIH Total Score: 0    NIH Stroke Scale/Score (NIHSS) from [email or web address]  on 10/24/2022  ** All calculations should be rechecked by clinician prior to use **    RESULT SUMMARY:  0 points  NIH Stroke Scale      INPUTS:  1A: Level of consciousness --> 0 = Alert; keenly responsive  1B: Ask month and age --> 0 = Both  questions right  1C: 'Blink eyes' & 'squeeze hands' --> 0 = Performs both tasks  2: Horizontal extraocular movements --> 0 = Normal  3: Visual fields --> 0 = Le visual loss  4: Facial palsy --> 0 = Normal symmetry  5A: Left arm motor drift --> 0 = Le drift for 10 seconds  5B: Right arm motor drift --> 0 = Le drift for 10 seconds  6A: Left leg motor drift --> 0 = Le drift for 5 seconds  6B: Right leg motor drift --> 0 = Le drift for 5 seconds  7: Limb Ataxia --> 0 = Le ataxia  8: Sensation --> 0 = Normal; Le sensory loss  9: Language/aphasia --> 0 = Normal; Le aphasia  10: Dysarthria --> 0 = Normal  11: Extinction/inattention --> 0 = Le abnormality           PROCEDURES    DIAGNOSTICS  Labs:  Labs listed below were reviewed and interpreted by me.  Results for orders placed or performed during the hospital encounter of 10/24/22   COMPREHENSIVE METABOLIC PANEL, NON-FASTING   Result Value Ref Range     SODIUM 140 136 - 145 mmol/L    POTASSIUM 4.5 3.5 - 5.1 mmol/L    CHLORIDE 103 98 - 107 mmol/L    CO2 TOTAL 26 21 - 32 mmol/L    ANION GAP 11 4 - 13 mmol/L    BUN 16 7 - 18 mg/dL    CREATININE 8.40 (H) 0.55 - 1.02 mg/dL    BUN/CREA RATIO 15     ESTIMATED GFR 65 >59 mL/min/1.7m^2    ALBUMIN 4.0 3.4 - 5.0 g/dL    CALCIUM 9.0 8.5 - 37.5 mg/dL    GLUCOSE 436 74 - 067 mg/dL    ALKALINE PHOSPHATASE 63 46 - 116 U/L    ALT (SGPT) 12 <=78 U/L    AST (SGOT) 15 15 - 37 U/L    BILIRUBIN TOTAL 0.9 0.2 - 1.0 mg/dL    PROTEIN TOTAL 7.8 6.4 - 8.2 g/dL    ALBUMIN/GLOBULIN RATIO 1.1 0.8 - 1.4    OSMOLALITY, CALCULATED 281 270 - 290 mOsm/kg    CALCIUM, CORRECTED 9.0 mg/dL    GLOBULIN 3.8    PT/INR   Result Value Ref Range    PROTHROMBIN TIME 12.3 9.8 - 12.7 seconds    INR 1.06 0.88 - 1.10   PTT (PARTIAL THROMBOPLASTIN TIME)   Result Value Ref Range    APTT 37.7 (H) 22.0 - 31.7 seconds   TROPONIN-I   Result Value Ref Range    TROPONIN I 9 <15 ng/L   CBC WITH DIFF   Result Value Ref Range    WBC 10.0 4.0 - 10.5 x10^3/uL    RBC 5.24 4.20 - 5.40 x10^6/uL    HGB 16.9 (H) 12.5 - 16.0 g/dL    HCT 70.3 (H) 40.3 - 47.0 %    MCV 93.6 78.0 - 99.0 fL    MCH 32.3 (H) 27.0 - 32.0 pg    MCHC 34.5 32.0 - 36.0 g/dL    RDW 52.4 81.8 - 59.0 %    PLATELETS 199 140 - 440 x10^3/uL    MPV 8.0 7.4 - 10.4 fL    NEUTROPHIL % 70 40 - 76 %    LYMPHOCYTE % 23 (L) 25 - 45 %    MONOCYTE % 4 0 - 12 %    EOSINOPHIL % 2 0 - 7 %    BASOPHIL % 0  0 - 3 %    NEUTROPHIL # 7.03 1.80 - 8.40 x10^3/uL    LYMPHOCYTE # 2.32 1.10 - 5.00 x10^3/uL    MONOCYTE # 0.44 0.00 - 1.30 x10^3/uL    EOSINOPHIL # 0.16 0.00 - 0.80 x10^3/uL    BASOPHIL # 0.04 0.00 - 0.30 x10^3/uL   NT-PROBNP   Result Value Ref Range    NT-PROBNP 67 <=125 pg/mL   ECG 12 LEAD   Result Value Ref Range    Ventricular rate 77 BPM    Atrial Rate 77 BPM    PR Interval 158 ms    QRS Duration 94 ms    QT Interval 376 ms    QTC Calculation 425 ms    Calculated P Axis 54 degrees    Calculated R Axis -25 degrees     Calculated T Axis 65 degrees   POC BLOOD GLUCOSE (RESULTS)   Result Value Ref Range    GLUCOSE, POC 107 (H) 70 - 100 mg/dl     Radiology:  Results for orders placed or performed during the hospital encounter of 10/24/22   CT STROKE PROTOCOL (CTA HEAD/NECK WO/W)     Status: None    Narrative    Jahniah Lader    RADIOLOGIST: Karolee Stamps    CT STROKE PROTOCOL (CTA HEAD/NECK WO/W) performed on 10/24/2022 10:39 AM    CLINICAL HISTORY: Suspected Stroke.  Suspected Stroke    TECHNIQUE:  Head CT without intravenous contrast followed by CTA imaging of the head and neck from the aortic arch to the skull vertex with intravenous contrast.  3D reconstructions.    CONTRAST: 100 ml's of Omnipaque 350    COMPARISON:  None.    FINDINGS:  CT Brain:  There is Le acute intracranial hemorrhage, mass effect, or evidence of large transcortical acute to subacute ischemic infarct.    Brain: Gray-white matter differentiation appears preserved. Cerebellar tonsils are in normal position.    CSF Spaces: The ventricular system, basilar cisterns and cortical sulci are normal in size, shape and position. Le abnormal hyperdense extra axial fluid collection is identified.     Sinuses/Mastoids:  Minimal maxillary sinus floor mucosal thickening of the right is present. The paranasal sinuses are otherwise clear. The mastoid air cells and middle ear cavities are well-aerated bilaterally.     Bones: Le acute osseous abnormality or osseous destructive lesion.      CTA:  Aortic Arch: Normal size and branching pattern.  Le significant atherosclerotic plaque.  Brachiocephalic and Subclavians:  Unremarkable    Right Carotid: Atherosclerotic changes are not seen.  Right CCA: Normal.  Right ICA: Normal.  Right ECA: Normal.    Left Carotid: Mild curvilinear calcified and noncalcified atherosclerotic changes about the left carotid bifurcation and proximal left internal carotid artery present.  Left CCA: Le hemodynamically significant stenosis or  occlusion.  Left ICA: Le hemodynamically significant stenosis or occlusion.  Left ECA: Patent and otherwise unremarkable.    Vertebrals:  Codominant and patent throughout the course. Arise from the subclavians. Both vertebrals form the basilar.    CTA BRAIN:  Le intracranial aneurysms or large vascular malformations are identified.  Intracranial internal carotid arteries: Atherosclerotic changes of the supraclinoid left internal carotid artery present. Le hemodynamically significant stenosis or occlusion.  Anterior cerebral arteries:  Unremarkable.  Middle cerebral arteries:  Unremarkable    Basilar artery:  Unremarkable  Posterior cerebral arteries:  Unremarkable  Other major branches of the posterior circulation:  Unremarkable as visualized.  Major venous structures:  Unremarkable  C5-C7 discogenic degenerative changes with degenerative arthritic spurring present.      Impression    1.  Le ACUTE INTRACRANIAL HEMORRHAGE, MASS EFFECT OR EVIDENCE OF ACUTE/SUBACUTE TRANSCORTICAL ISCHEMIC INFARCT.  2. 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  3.  Le HEMODYNAMICALLY SIGNIFICANT CAROTID ARTERY STENOSIS.  4.  MILD ATHEROSCLEROTIC CHANGES OF THE LEFT CAROTID BIFURCATION.  5. PRELIMINARY NONCONTRAST HEAD CT FINDINGS WERE CALLED TO DR Randall Rampersad IN THE EMERGENCY DEPARTMENT ON 10/24/2022 AT 10:52 AM      One or more dose reduction techniques were used (e.g., Automated exposure control, adjustment of the mA and/or kV according to patient size, use of iterative reconstruction technique).      Radiologist location ID: [id number]         ED COURSE/MEDICAL DECISION MAKING  Medications Administered in the ED   iohexol (OMNIPAQUE 350) infusion (100 mL Intravenous Given 10/24/22 1041)          Medical Decision Making  52 year old female with paresthesias heaviness the upper extremities Le head trauma Le neck trauma.  Left worse than right.  Differential includes  stroke, TIA, new medications specifically fluconazole.    11:22 a.m. CT results discussed with the patient she does not want to do thrombolytics at this time.  NIH is 0 she is alert and oriented x4 we discussed risks    11:55 a.m. CT angiogram also likewise unremarkable.  I did call the tele neurology line but the busy.  Records indicate Dr. Marry Guan is on-call.  I have sent a secure chat via epic    12:07 p.m. I did talk to Dr. Marry Guan.  He states he is busy with another case right now and to call the [facility] line for backup tele neurology consult I have discussed this with the [facility] line they are unaware of a backup tele neurology but we will call me back if they find out otherwise    12:38 p.m. I reviewed the test results we have back with the patient.  We discussed the plan to consult Neurology with a possible transferred to [facility] for further testing.  Patient is alert oriented x4.  She really does not want to proceed with this plan.  She feels that is the new medications though fluconazole that she will took for the 1st yesterday.  Certainly the timing is suspicious this point she would rather just go home she is going to stop take the fluconazole and consult with her doctor for an alternative.  She is alert and oriented x4 and verbalized understanding of risks andthat a negative CT does not definitively rule out a stroke    Amount and/or Complexity of Data Reviewed  Labs: ordered.  Radiology: ordered.  ECG/medicine tests: ordered and independent interpretation performed.     Details: Normal sinus rhythm rate of 77 incomplete right bundle-branch block negative QRS axis normal intervals      CRITICAL CARE    CLINICAL IMPRESSION  Clinical Impression   Paresthesia (Primary)   Medication reaction, initial encounter     DISPOSITION  Discharged       DISCHARGE MEDICATIONS  Current Discharge Medication List          //Doug Kinnie Scales M.D.   10/24/2022, 10:18   [facility]  Department of Emergency  Medicine  [facility]    This note was partially generated using MModal Fluency Direct system, and there may be some incorrect words,  spellings, and punctuation that were not noted in checking the note before saving.    -----

## 2022-10-24 NOTE — ED Nurses Note (Signed)
Pt awake alert skin warm and dry. No distress noted. Neuro check complete. No changes noted.

## 2022-10-24 NOTE — ED Nurses Note (Signed)
Pt back from CT. Alert and oriented. Cardiac monitor shows SR without etcopy. 02 sat 98% No distress noted. Neuro check complete.

## 2022-10-24 NOTE — ED Nurses Note (Signed)
Neuro check complete. No changes noted.  Pt DC home ambulatory. To follow up with PCP. Verbal and written instructions given. Voices understanding.

## 2022-10-24 NOTE — ED Nurses Note (Signed)
Neuro check complete. No changes noted. Pt states, "I think the Flagyl is causing my symptoms. I'm not going to take it anymore."

## 2022-10-26 DIAGNOSIS — R9431 Abnormal electrocardiogram [ECG] [EKG]: Secondary | ICD-10-CM

## 2022-10-26 DIAGNOSIS — I4519 Other right bundle-branch block: Secondary | ICD-10-CM

## 2022-10-26 LAB — ECG 12 LEAD
Atrial Rate: 77 {beats}/min
Calculated P Axis: 54 degrees
Calculated R Axis: -25 degrees
Calculated T Axis: 65 degrees
PR Interval: 158 ms
QRS Duration: 94 ms
QT Interval: 376 ms
QTC Calculation: 425 ms
Ventricular rate: 77 {beats}/min

## 2022-10-27 NOTE — Progress Notes (Signed)
[facility] [place]       Assessment and Plan       Return in about 2 months (around 12/27/2022) for routine follow up.   Patient voiced understanding and is agreeable to plan   Shared decision making with patient.  Will treat suprapubic pain as cystitis.  Patient to follow up if symptoms do not improve.  Given she has had 3 doses of diflucan suspect candida is resolved.  No second line treatment.  Imaging for follow up of stroke like symptoms and referral to neurology.    Encounter Diagnosis     ICD-10-CM    1. HTN (hypertension), benign  I10 [place] DUPLX EXTRACRAN/CAROTID BI      2. Stroke-like symptoms  R29.90 [place] DUPLX EXTRACRAN/CAROTID BI     MRI BRAIN WO CONT     AMB REFERRAL TO NEUROLOGY      3. Hyperlipidemia, unspecified hyperlipidemia type  E78.5 LIPID PROFILE 1(LIP1)          Subjective:      Patient ID and HPI: Jocelyn Le is an 52 y.o. female.       Chief Complaint:  New Patient (Establish care. )      Patient is here to establish care.  Patient last saw no one, around  n/a. Patient reports past medical history significant for none.     Started with brown vaginal discharge in November.  Course of clindamycin cleared symptoms.  Last period 10/2021.  Had another period in February and resolved.  C/o UTI symptoms soon after.  Evaluated by [facility], no UTI.  Saw Dr. Vida Rigger diagnosed with candida glabrata. She has 3 doses of fluconazole.  Mold blood test pending.  States hx of skin infection from black mold in the past.  She started fluconazole and had side effects - stroke like symptoms.  She advised had another episode with similar symptoms 2 days later.  Resolved without treatment.  She now c/o soreness to suprapubic area.  Recent ER visits reviewed.      ROS:  Cardiovascular: Negative for chest pain, palpitations, leg swelling.   Respiratory: Negative for dyspnea, cough, and sputum production.    Gastrointestinal: Negative for N/V, changes in bowel habits.   Genitourinary:  Negative for changes in voiding habits.     Patient also follows with:  Dr. Vida Rigger, OBGYN     Health Maintenance  Pap smear: unknown    Mammogram: unknown    Colonoscopy: unknown    DEXA: none    Lung CA screening for smoker: n/a      Lab work  CBC: No results found for: "WBC", "HGB", "HGBISTAT", "HCT", "HCTISTAT", "PLT", "MCV"    CMP: No results found for: "NA", "NAISTAT", "K", "KISTAT", "CL", "CLISTAT", "CO2", "BUN", "BUNISTAT", "CREATININE", "GLU", "GLUISTAT", "PROT", "ALBUMIN", "CA", "BILITOT", "ALKPHOS", "AST", "ALT", "GLO"    GFR: No results found for: "GFR"    A1C: No results found for: "HGBA1C"    Lipid Panel: No results found for: "CHOL", "TRIG", "HDL", "LDLCALC", "TCHDL"    TSH: No results found for: "TSH", "T4FREEDIRECT", "FREET4", "T3TOTAL"    Vitamin D: No results found for: "VITD25"          Past Medical History:   Diagnosis Date   . Asthma      Past Surgical History:   Procedure Laterality Date   . HX CHOLECYSTECTOMY       Family History   Problem Relation Name Age of Onset   . Breast Cancer Mother     .  Cancer Father     . Cancer Maternal Grandmother       Social History     Tobacco Use   . Smoking status: Former     Types: Cigarettes     Quit date: 2023     Years since quitting: 1.2   . Smokeless tobacco: Never   Vaping Use   . Vaping Use: Never used   Substance Use Topics   . Alcohol use: Never   . Drug use: Never     Allergies   Allergen Reactions   . Smallpox Vaccine,Chick-Embryo,Live Other - See Comments     No current outpatient medications on file as of 10/27/2022.         ROS--see HPI     Objective:     Vitals:    10/27/22 1425 10/27/22 1438   BP: (!) 153/90 (!) 140/85   Pulse: 86    Resp: 18    Temp: 97.3 F (36.3 C)    SpO2: 98%    Weight: 92.1 kg (203 lb)    Height: 1.676 m ( )          Physical Exam  Vitals and nursing note reviewed.   Constitutional:       Appearance: Normal appearance. She is normal weight.   HENT:      Head: Normocephalic and atraumatic.      Right Ear:  External ear normal.      Left Ear: External ear normal.      Nose: Nose normal.      Mouth/Throat:      Mouth: Mucous membranes are moist.      Pharynx: Oropharynx is clear.   Cardiovascular:      Rate and Rhythm: Normal rate and regular rhythm.      Pulses: Normal pulses.      Heart sounds: Normal heart sounds.   Pulmonary:      Effort: Pulmonary effort is normal.      Breath sounds: Normal breath sounds.   Abdominal:      General: Abdomen is flat. Bowel sounds are normal.      Palpations: Abdomen is soft.   Musculoskeletal:         General: Normal range of motion.   Skin:     General: Skin is warm and dry.      Capillary Refill: Capillary refill takes less than 2 seconds.      Comments: No abdominal or inguinal skin rash.   Neurological:      General: No focal deficit present.      Mental Status: She is alert and oriented to person, place, and time.   Psychiatric:         Mood and Affect: Mood normal.         Behavior: Behavior normal.            No results found for this visit on 10/27/22.      Portions of this note were dictated using Dragon medical dictation software and may contain occasional typographical errors.  Please seek clarification from the author as needed.    Frederic Jericho, NP-C 10/27/2022 4:56 PM

## 2022-11-02 NOTE — Telephone Encounter (Signed)
Patient called stating during her recent visit she had spoke with provider about the Diflucan regimen that she had got in the past from Dr. Jenness Corner.     Patient requesting that a 2 week course of Diflucan be sent to [facility] in [place]. Patient states she isn't going to come in for a visit and pay a co pay for a medication.

## 2022-11-02 NOTE — Telephone Encounter (Signed)
Spoke with patient. States she is agreeable to have a referral to a different OB/GYN.

## 2022-11-03 ENCOUNTER — Emergency Department
Admission: EM | Admit: 2022-11-03 | Discharge: 2022-11-03 | Disposition: A | Discharge status: Home / Self Care | Payer: BLUE CROSS/BLUE SHIELD | Attending: Emergency Medicine | Admitting: Emergency Medicine

## 2022-11-03 ENCOUNTER — Emergency Department (HOSPITAL_COMMUNITY): Payer: BLUE CROSS/BLUE SHIELD

## 2022-11-03 ENCOUNTER — Encounter (HOSPITAL_BASED_OUTPATIENT_CLINIC_OR_DEPARTMENT_OTHER): Payer: Self-pay

## 2022-11-03 ENCOUNTER — Emergency Department (HOSPITAL_BASED_OUTPATIENT_CLINIC_OR_DEPARTMENT_OTHER): Payer: BLUE CROSS/BLUE SHIELD

## 2022-11-03 ENCOUNTER — Emergency Department (EMERGENCY_DEPARTMENT_HOSPITAL)
Admission: EM | Admit: 2022-11-03 | Discharge: 2022-11-03 | Disposition: A | Discharge status: Home / Self Care | Payer: BLUE CROSS/BLUE SHIELD | Source: Home / Self Care

## 2022-11-03 ENCOUNTER — Encounter (HOSPITAL_COMMUNITY): Payer: Self-pay | Admitting: Student in an Organized Health Care Education/Training Program

## 2022-11-03 ENCOUNTER — Other Ambulatory Visit: Payer: Self-pay

## 2022-11-03 DIAGNOSIS — F419 Anxiety disorder, unspecified: Secondary | ICD-10-CM

## 2022-11-03 DIAGNOSIS — M25512 Pain in left shoulder: Secondary | ICD-10-CM

## 2022-11-03 DIAGNOSIS — R4589 Other symptoms and signs involving emotional state: Secondary | ICD-10-CM

## 2022-11-03 DIAGNOSIS — R42 Dizziness and giddiness: Secondary | ICD-10-CM | POA: Insufficient documentation

## 2022-11-03 DIAGNOSIS — R9431 Abnormal electrocardiogram [ECG] [EKG]: Secondary | ICD-10-CM | POA: Insufficient documentation

## 2022-11-03 DIAGNOSIS — I4892 Unspecified atrial flutter: Secondary | ICD-10-CM | POA: Insufficient documentation

## 2022-11-03 DIAGNOSIS — R0789 Other chest pain: Secondary | ICD-10-CM | POA: Insufficient documentation

## 2022-11-03 DIAGNOSIS — I10 Essential (primary) hypertension: Secondary | ICD-10-CM | POA: Insufficient documentation

## 2022-11-03 DIAGNOSIS — R079 Chest pain, unspecified: Secondary | ICD-10-CM | POA: Insufficient documentation

## 2022-11-03 LAB — COMPREHENSIVE METABOLIC PANEL, NON-FASTING
ALBUMIN/GLOBULIN RATIO: 1 (ref 0.8–1.4)
ALBUMIN: 3.8 g/dL (ref 3.4–5.0)
ALKALINE PHOSPHATASE: 78 U/L (ref 46–116)
ALT (SGPT): 14 U/L (ref <=78)
ANION GAP: 8 mmol/L (ref 4–13)
AST (SGOT): 12 U/L — ABNORMAL LOW (ref 15–37)
BILIRUBIN TOTAL: 0.6 mg/dL (ref 0.2–1.0)
BUN/CREA RATIO: 12
BUN: 11 mg/dL (ref 7–18)
CALCIUM, CORRECTED: 8.9 mg/dL
CALCIUM: 8.7 mg/dL (ref 8.5–10.1)
CHLORIDE: 104 mmol/L (ref 98–107)
CO2 TOTAL: 27 mmol/L (ref 21–32)
CREATININE: 0.91 mg/dL (ref 0.55–1.02)
ESTIMATED GFR: 77 mL/min/{1.73_m2} (ref >59)
GLOBULIN: 3.7
GLUCOSE: 103 mg/dL (ref 74–106)
OSMOLALITY, CALCULATED: 277 mOsm/kg (ref 270–290)
POTASSIUM: 3.6 mmol/L (ref 3.5–5.1)
PROTEIN TOTAL: 7.5 g/dL (ref 6.4–8.2)
SODIUM: 139 mmol/L (ref 136–145)

## 2022-11-03 LAB — CBC WITH DIFF
BASOPHIL #: 0.05 10*3/uL (ref 0.00–0.30)
BASOPHIL %: 0 % (ref 0–3)
EOSINOPHIL #: 0.32 10*3/uL (ref 0.00–0.80)
EOSINOPHIL %: 3 % (ref 0–7)
HCT: 46.3 % (ref 37.0–47.0)
HGB: 16.2 g/dL — ABNORMAL HIGH (ref 12.5–16.0)
LYMPHOCYTE #: 4.03 10*3/uL (ref 1.10–5.00)
LYMPHOCYTE %: 35 % (ref 25–45)
MCH: 32.9 pg — ABNORMAL HIGH (ref 27.0–32.0)
MCHC: 35.1 g/dL (ref 32.0–36.0)
MCV: 94 fL (ref 78.0–99.0)
MONOCYTE #: 0.63 10*3/uL (ref 0.00–1.30)
MONOCYTE %: 5 % (ref 0–12)
MPV: 8 fL (ref 7.4–10.4)
NEUTROPHIL #: 6.48 10*3/uL (ref 1.80–8.40)
NEUTROPHIL %: 56 % (ref 40–76)
PLATELETS: 209 10*3/uL (ref 140–440)
RBC: 4.93 10*6/uL (ref 4.20–5.40)
RDW: 14.8 % (ref 11.6–14.8)
WBC: 11.5 10*3/uL — ABNORMAL HIGH (ref 4.0–10.5)

## 2022-11-03 LAB — GRAY TOP TUBE

## 2022-11-03 LAB — BLUE TOP TUBE

## 2022-11-03 LAB — TROPONIN-I
TROPONIN I: <2 ng/L (ref <15)
TROPONIN I: <2 ng/L (ref <15)

## 2022-11-03 LAB — GOLD TOP TUBE

## 2022-11-03 MED ORDER — ASPIRIN 81 MG CHEWABLE TABLET
CHEWABLE_TABLET | ORAL | Status: AC
Start: 2022-11-03 — End: 2022-11-03
  Filled 2022-11-03: qty 4

## 2022-11-03 MED ORDER — ASPIRIN 81 MG CHEWABLE TABLET
324.0000 mg | CHEWABLE_TABLET | ORAL | Status: AC
Start: 2022-11-03 — End: 2022-11-03
  Administered 2022-11-03: 324 mg via ORAL

## 2022-11-03 NOTE — ED Nurses Note (Signed)
Pt verbalized understanding of discharge instructions. Denies any questions or concerns at this time. Respirations even and unlabored w/ no s/s of acute distress noted at this time.

## 2022-11-03 NOTE — ED Nurses Note (Signed)
Pt states chest pain has resolved, shoulder pain continues. No s/s of acute distress noted at this time.

## 2022-11-03 NOTE — Discharge Instructions (Signed)
I your evaluation did not reveal critical condition requiring immediate hospitalization.  Follow up with primary care ideally within next 2-3 days.  Reach out to Cardiology ideally within the next 2-3 days.  Return to emergency department sooner if you have new worsening chest pain shortness breath or any other concerning symptoms.  Thank you for visiting [facility].

## 2022-11-03 NOTE — ED Nurses Note (Addendum)
Pt c/o midsternal chest pain that radiates to L shoulder. Also c/o dizziness and intermittent nausea. Pt states pain began aprx 30 min PTA. Pt denies cardiac hx. Pt states she was recently seen in this ED 1 week ago because she was concerned she was having a stroke. Denies shortness of breath. Respirations even and unlabored w/ no s/s of acute distress noted at this time. Pt appears very anxious.Pt is also concerned that she has an aneurysm. Pt placed on monitor w/ call light within reach.

## 2022-11-03 NOTE — ED Triage Notes (Signed)
Left shoulder pain x 2 days

## 2022-11-03 NOTE — ED Nurses Note (Signed)
Patient discharged home with family.  AVS reviewed with patient/care giver.  A written copy of the AVS and discharge instructions was given to the patient/care giver.  Questions sufficiently answered as needed.  Patient/care giver encouraged to follow up with PCP as indicated.  In the event of an emergency, patient/care giver instructed to call 911 or go to the nearest emergency room.

## 2022-11-03 NOTE — ED Nurses Note (Signed)
Sling applied to left shoulder. Left before receiving discharge instructions.

## 2022-11-03 NOTE — ED Nurses Note (Signed)
Sling applied to left arm.

## 2022-11-03 NOTE — ED Nurses Note (Addendum)
Pt very anxious and refuses to take aspirin. Pt states "I've always been told not to take aspirin since I was a child. Aspirin is bad for you and can make you bleed internally". Adv pt the benefits of taking aspirin when having chest pains. Pt refused aspirin at this time stating "I am worried I may be having an aneurysm in my brain and this would make it worse."  Pt agreed to keep aspirin at bedside and will consider taking aspirin.

## 2022-11-03 NOTE — ED Triage Notes (Signed)
Pt reports intermittent CP and left shoulder pain X 1 day. Tonight woke her up from her sleep X 15 - 30 minutes ago.Denies cardiac hx

## 2022-11-03 NOTE — ED Provider Notes (Signed)
[facility] Emergency Department  ED Primary Provider Note  HPI:  Jocelyn Le is a 52 y.o. female     Patient complains of left shoulder pain 1 day duration.  She feels a bit of a flutter in her her chest localized to the sternum/epigastrium which comes and goes.  She was not short of breath.  Occasionally feels dizzy.  Could not specify the time. Marland Kitchen  She was not passed out.  No lateralizing weakness no slurring of speech.  She has taken no medications for this.  Review of chart shows patient sad clinical encounters 10/27/2022.  She was recently diagnosed by Dr. Lyda Jester and felt a for Candida glabrata started on fluconazole and states that she had stroke-like symptoms at the time.  Stroke symptoms resolved.  Patient denies cardiac history.  Denies hypertension hyperlipidemia diabetes..  Full code.        ROS review and negative aside from stated in HPI.    Physical Exam:  ED Triage Vitals [11/03/22 0200]   BP (Non-Invasive) (!) 160/114   Heart Rate 79   Respiratory Rate 16   Temperature (!) 35.7 C (96.2 F)   SpO2 99 %   Weight 88.9 kg (196 lb)   Height 1.676 m ( )     No acute distress.  Patient awake alert oriented x3.  Patient appears anxious redirectable  Pupils 3 mm equal round reactive.  Extraocular movements are intact.  Oropharynx is clear.  Mucous membranes moist.  Trachea midline.  Neck is supple.  Heart has regular rate and rhythm without significant murmurs rubs or gallops.  Lungs are clear to auscultation.  Abdomen soft nontender, nondistended.  Moving all extremities without difficulty.  No rash no edema.      Patient data:  Labs Ordered/Reviewed   COMPREHENSIVE METABOLIC PANEL, NON-FASTING - Abnormal; Notable for the following components:       Result Value    AST (SGOT) 12 (*)     All other components within normal limits    Narrative:     Estimated Glomerular Filtration Rate (eGFR) is calculated using the CKD-EPI (2021) equation, intended for patients  64 years of age and older. If gender is not documented or "unknown", there will be no eGFR calculation.   CBC WITH DIFF - Abnormal; Notable for the following components:    WBC 11.5 (*)     HGB 16.2 (*)     MCH 32.9 (*)     All other components within normal limits   TROPONIN-I - Normal    Narrative:     Values received on females ranging between 12-15 ng/L MUST include the next serial troponin to review changes in the delta differences as the reference range for the Access II chemistry analyzer is lower than the established reference range.     TROPONIN-I - Normal    Narrative:     Values received on females ranging between 12-15 ng/L MUST include the next serial troponin to review changes in the delta differences as the reference range for the Access II chemistry analyzer is lower than the established reference range.     CBC/DIFF    Narrative:     The following orders were created for panel order CBC/DIFF.  Procedure                               Abnormality  Status                     ---------                               -----------         ------                     CBC WITH HQIO[[id number]]                Abnormal            Final result                 Please view results for these tests on the individual orders.     No orders to display     EKG read by me shows sinus rhythm with a rate of 82, normal axis, QTC is 443.  I do not appreciate significant ST or T-wave changes no delta waves are S1 Q3 T3 pattern  MDM:    Left shoulder pain chest pain.  Broad DX includes will evaluate for acute coronary syndrome, arrhythmia.  Anxiety is likely a strong contributor.  Patient hypertensive to 160/114 temperature 96.2 F. EKG nonischemic.  No evidence of pre-excitation.  Serial troponin to X within normal limits.  Remainder of lab work CBC CMP unremarkable.  Chest x-ray showed no acute abnormality.  I did review these findings with the patient and answered her questions.  Patient declined aspirin    , see  nursing documentation.  Seen resting comfortably on re-evaluation.  Chest pain-free.  Patient has a heart score of 2 stratifies her is low risk with less than 2% chance of 30 day Mace.  She was appropriate for discharge.  I did review my assessment with the patient and answered her questions.  She still anxious worried about various catastrophic events including stroke and aneurysm.  I estimate low probability of critical pathology at this time.  I referred patient to primary care.  She would benefit from a dressing anxiety about health.  Strict return to ED instructions were provided in both a verbal and written manner.      Discharged  Clinical Impression   Atypical chest pain (Primary)   Anxiety about health     Medications Administered in the ED   aspirin chewable tablet 324 mg (324 mg Oral Given 11/03/22 0209)        Current Discharge Medication List        CONTINUE these medications - NO CHANGES were made during your visit.        Details   cholecalciferol (vitamin D3) 250 mcg (10,000 unit) Capsule   2,000 Units, Oral, DAILY  Refills: 0     fluconazole 100 mg Tablet  Commonly known as: DIFLUCAN   100 mg, Oral, 2 TIMES DAILY  Refills: 0     multivitamin with iron Tablet   1 Tablet, Oral, DAILY  Refills: 0

## 2022-11-03 NOTE — ED Provider Notes (Signed)
[facility]  ED Primary Provider Note  History of Present Illness   Chief Complaint   Patient presents with    Shoulder Pain     Jocelyn Le is a 52 y.o. female who had concerns including Shoulder Pain.  Arrival: The patient arrived by Car    52 year old female arrives today via POV alert and oriented x4 ambulatory complaining of left shoulder pain.  Patient reports pain has been present for 2 days.  Patient reports the patient thought she slept on it wrong you with the pain has not subsided.  Patient was felt to mentioned that she had been seen just prior to come to the ED at [facility] emergency department for her chest pain.  When inquired about patient stated that she went there because she thought she was having a heart attack.  Patient states that "I can not stay off [employer] and it worries me".  Patient denies shortness of breath, chest pain, recent trauma.      History Reviewed This Encounter: Medical History  Surgical History  Family History  Social History    Physical Exam   ED Triage Vitals [11/03/22 1545]   BP (Non-Invasive) (!) 154/93   Heart Rate (!) 101   Respiratory Rate 20   Temperature 37.2 C (98.9 F)   SpO2 99 %   Weight 88.9 kg (196 lb)   Height 1.707 m (5' 7.2")     Physical Exam  Vitals and nursing note reviewed.   Constitutional:       Appearance: Normal appearance.   HENT:      Head: Normocephalic and atraumatic.   Cardiovascular:      Rate and Rhythm: Normal rate and regular rhythm.      Pulses: Normal pulses.      Heart sounds: Normal heart sounds.   Pulmonary:      Effort: Pulmonary effort is normal.      Breath sounds: Normal breath sounds.   Musculoskeletal:      Right shoulder: Normal.      Left shoulder: Tenderness (Generalized tenderness) present. No swelling, deformity, effusion, laceration, bony tenderness or crepitus. Decreased range of motion. Normal strength. Normal pulse.   Skin:     Capillary Refill: Capillary refill takes less than 2  seconds.   Neurological:      General: No focal deficit present.      Mental Status: She is alert and oriented to person, place, and time.       Patient Data   Labs Ordered/Reviewed - No data to display  XR SHOULDER LEFT   Final Result by Edi, Radresults In (04/19 1610)   NEGATIVE SHOULDER SERIES                Radiologist location ID: [id number]           Medical Decision Making        Medical Decision Making  Patient was seen at [facility] emergency department where she underwent a complete cardiac workup.  Patient received x-ray of left shoulder here in the emergency department which was unremarkable for any acute findings.  Patient was given a sling for position of comfort.  Patient advised follow up with orthopedist if pain continues.  Advised infection with Tylenol and ibuprofen.  Patient is fit for discharge.  All patient's questions and concerns addressed.      Amount and/or Complexity of Data Reviewed  Radiology: ordered.  Clinical Impression   Left shoulder pain (Primary)       Disposition: Discharged

## 2022-11-03 NOTE — Discharge Instructions (Signed)

## 2022-11-04 DIAGNOSIS — R9431 Abnormal electrocardiogram [ECG] [EKG]: Secondary | ICD-10-CM

## 2022-11-04 LAB — ECG 12 LEAD
Atrial Rate: 82 {beats}/min
Calculated P Axis: 46 degrees
Calculated R Axis: -23 degrees
Calculated T Axis: 41 degrees
PR Interval: 144 ms
QRS Duration: 88 ms
QT Interval: 380 ms
QTC Calculation: 443 ms
Ventricular rate: 82 {beats}/min

## 2022-12-14 ENCOUNTER — Other Ambulatory Visit (HOSPITAL_COMMUNITY): Payer: Self-pay

## 2022-12-14 ENCOUNTER — Encounter (HOSPITAL_COMMUNITY): Payer: Self-pay

## 2022-12-14 DIAGNOSIS — Z1231 Encounter for screening mammogram for malignant neoplasm of breast: Secondary | ICD-10-CM

## 2023-01-10 ENCOUNTER — Encounter (RURAL_HEALTH_CENTER): Payer: Self-pay | Admitting: INTERNAL MEDICINE

## 2023-01-10 ENCOUNTER — Ambulatory Visit (RURAL_HEALTH_CENTER): Payer: Commercial Managed Care - PPO | Attending: INTERNAL MEDICINE | Admitting: INTERNAL MEDICINE

## 2023-01-10 ENCOUNTER — Other Ambulatory Visit: Payer: Self-pay

## 2023-01-10 ENCOUNTER — Ambulatory Visit: Payer: 59 | Attending: INTERNAL MEDICINE | Admitting: INTERNAL MEDICINE

## 2023-01-10 VITALS — BP 135/90 | HR 78 | Resp 18 | Ht 67.0 in | Wt 225.1 lb

## 2023-01-10 DIAGNOSIS — I10 Essential (primary) hypertension: Secondary | ICD-10-CM | POA: Insufficient documentation

## 2023-01-10 DIAGNOSIS — B379 Candidiasis, unspecified: Secondary | ICD-10-CM | POA: Insufficient documentation

## 2023-01-10 DIAGNOSIS — B3731 Acute candidiasis of vulva and vagina: Secondary | ICD-10-CM | POA: Insufficient documentation

## 2023-01-10 LAB — COMPREHENSIVE METABOLIC PNL, FASTING
ALBUMIN/GLOBULIN RATIO: 1.2 (ref 0.8–1.4)
ALBUMIN: 4.4 g/dL (ref 3.5–5.7)
ALKALINE PHOSPHATASE: 57 U/L (ref 34–104)
ALT (SGPT): 14 U/L (ref 7–52)
ANION GAP: 7 mmol/L (ref 4–13)
AST (SGOT): 18 U/L (ref 13–39)
BILIRUBIN TOTAL: 1 mg/dL (ref 0.3–1.2)
BUN/CREA RATIO: 13 (ref 6–22)
BUN: 12 mg/dL (ref 7–25)
CALCIUM, CORRECTED: 9.6 mg/dL (ref 8.9–10.8)
CALCIUM: 9.9 mg/dL (ref 8.6–10.3)
CHLORIDE: 107 mmol/L (ref 98–107)
CO2 TOTAL: 25 mmol/L (ref 21–31)
CREATININE: 0.96 mg/dL (ref 0.60–1.30)
ESTIMATED GFR: 72 mL/min/{1.73_m2} (ref >59)
GLOBULIN: 3.6 (ref 2.9–5.4)
GLUCOSE: 89 mg/dL (ref 74–109)
OSMOLALITY, CALCULATED: 277 mOsm/kg (ref 270–290)
POTASSIUM: 3.9 mmol/L (ref 3.5–5.1)
PROTEIN TOTAL: 8 g/dL (ref 6.4–8.9)
SODIUM: 139 mmol/L (ref 136–145)

## 2023-01-10 LAB — CBC WITH DIFF
BASOPHIL #: 0 10*3/uL (ref 0.00–0.10)
BASOPHIL %: 1 % (ref 0–1)
EOSINOPHIL #: 0.1 10*3/uL (ref 0.00–0.50)
EOSINOPHIL %: 1 %
HCT: 46.3 % — ABNORMAL HIGH (ref 31.2–41.9)
HGB: 16.4 g/dL — ABNORMAL HIGH (ref 10.9–14.3)
LYMPHOCYTE #: 1.9 10*3/uL (ref 1.00–3.00)
LYMPHOCYTE %: 23 % (ref 16–44)
MCH: 32.2 pg (ref 24.7–32.8)
MCHC: 35.4 g/dL (ref 32.3–35.6)
MCV: 91 fL (ref 75.5–95.3)
MONOCYTE #: 0.4 10*3/uL (ref 0.30–1.00)
MONOCYTE %: 4 % — ABNORMAL LOW (ref 5–13)
MPV: 9.3 fL (ref 7.9–10.8)
NEUTROPHIL #: 5.6 10*3/uL (ref 1.85–7.80)
NEUTROPHIL %: 70 % (ref 43–77)
PLATELETS: 193 10*3/uL (ref 140–440)
RBC: 5.08 10*6/uL — ABNORMAL HIGH (ref 3.63–4.92)
RDW: 13.7 % (ref 12.3–17.7)
WBC: 8 10*3/uL (ref 3.8–11.8)

## 2023-01-10 MED ORDER — HYDROCHLOROTHIAZIDE 12.5 MG CAPSULE
12.5000 mg | ORAL_CAPSULE | Freq: Every day | ORAL | 4 refills | Status: DC
Start: 2023-01-10 — End: 2023-03-22

## 2023-01-10 MED ORDER — CLOTRIMAZOLE 10 MG TROCHE
10.0000 mg | Freq: Every day | 0 refills | Status: DC
Start: 2023-01-10 — End: 2023-07-01

## 2023-01-10 NOTE — Progress Notes (Signed)
[facility]  [address]  [place], [place]  [address]  Phone: [phone] Fax: [phone]    Name: Jocelyn Le                       Date of Birth: 12-01-1970   MRN:  [id number]                         Date of visit: 01/10/2023     Chief Complaint: New Patient (Sees Tabitha Cox)    Current Outpatient Medications   Medication Sig    cholecalciferol, vitamin D3, 250 mcg (10,000 unit) Oral Capsule Take 2,000 Units by mouth Once a day (Patient not taking: Reported on 01/10/2023)    clotrimazole (MYCELEX) 10 mg Mucous Membrane Troche Take 1 Troche (10 mg total) by mouth Five times a day    multivitamin with iron Oral Tablet Take 1 Tablet by mouth Once a day    nystatin (MYCOSTATIN) 100,000 unit/mL Oral Suspension Take 5 mL by mouth Four times a day       Patient Active Problem List    Diagnosis Date Noted    Benign hypertension 01/10/2023    Candidiasis of vagina 10/23/2022       Subjective:   Patient is here to establish with primary care. Was following with Marlan Palau, NP-C at [facility]. Established with them 10-27-22.     Is following with Tabitha Cox at Dr Randa Evens for Gyn. Had also seen Dr Dyanne Iha office in April.     Complaining of subjective fever (states temp always 97.2) and enlarged lymph nodes in neck. Ongoing a few months.     Vitamin D deficiency  On Replacement    TIA  11-17-2022 MRI negative  Started after Diflucan Tx for Candida glabrata  Has since resolved    Hypertension  Not currently on treatment  States she has white coat hypertension, always 120/80    Candida Glabrata  Following with Tabitha Cox  On nystatin oral  On Amphoteracin B    Dysuria  Has apt with Urology in September. Patient not sure if she will keep.     ROS:  10 systems reviewed and were negative except as noted.     Objective :  BP (!) 135/90 (Site: Left, Patient Position: Sitting, Cuff Size: Adult)   Pulse 78   Resp 18   Ht 1.702 m (5\' 7" )   Wt 102 kg (225 lb 2 oz)   SpO2 98%   BMI  35.26 kg/m       General:  appears in good health, comfortable  Lungs:  Clear to auscultation bilaterally.   Cardiovascular:  regular rate and rhythm, S1, S2 normal, no murmur, click, rub or gallop  Abdomen:  Soft, non-tender  Neurologic:  Grossly normal. Alert and oriented x3  Psychiatric:  Normal  Tongue coated with white film. Could not scrape clear.   TM's dull b/l; nasal mucosa edematous with clear rhinorrhea  B/l submandibular nodes    Data reviewed:      Assessment/Plan:  Assessment/Plan   1. Benign hypertension    2. Candidiasis of vagina    3. Candida glabrata infection      Hypertension  Start HCTZ    Candidiasis  Candida Glabrata  Follow with Gyn  Suspect will need ID referral  Suspect lymphadenopathy is probably related to the candida  Try clotrimazole troche    Will get Ig panel to  see if we have any etiology for patient having atypical infection  WBC was normal    Terald Sleeper, DO, [facility]  Internal Medicine

## 2023-01-10 NOTE — Nursing Note (Signed)
Patient is here to establish primary care. Patient is seeing Tabitha Cox and is currently taking amphotericin B and Nystatin swish and swallow for 14 days.

## 2023-01-12 ENCOUNTER — Telehealth (RURAL_HEALTH_CENTER): Payer: Self-pay | Admitting: INTERNAL MEDICINE

## 2023-01-12 LAB — IMMUNOGLOBULIN PROFILE (IGA, IGG, AND IGM), SERUM
IMMUNOGLOBULIN A (IGA): 280 mg/dL (ref 85–499)
IMMUNOGLOBULIN G (IGG): 1220 mg/dL (ref 610–1616)
IMMUNOGLOBULIN M (IGM): 223 mg/dL (ref 35–242)

## 2023-01-12 NOTE — Telephone Encounter (Signed)
-----   Message from Terald Sleeper, DO sent at 01/12/2023 11:16 AM EDT -----  Cliffton Asters count completely normal  No evidence of any immune deficiency  So I dont have a good reason for the infections, sorry

## 2023-01-12 NOTE — Telephone Encounter (Signed)
Pt was notified.  

## 2023-01-15 ENCOUNTER — Other Ambulatory Visit: Payer: Self-pay

## 2023-02-08 ENCOUNTER — Ambulatory Visit
Admission: RE | Admit: 2023-02-08 | Discharge: 2023-02-08 | Disposition: A | Discharge status: Home / Self Care | Payer: 59 | Source: Ambulatory Visit

## 2023-02-08 ENCOUNTER — Encounter (HOSPITAL_COMMUNITY): Payer: Self-pay

## 2023-02-08 ENCOUNTER — Other Ambulatory Visit: Payer: Self-pay

## 2023-02-08 DIAGNOSIS — Z1231 Encounter for screening mammogram for malignant neoplasm of breast: Secondary | ICD-10-CM | POA: Insufficient documentation

## 2023-02-13 ENCOUNTER — Other Ambulatory Visit (HOSPITAL_COMMUNITY): Payer: Self-pay

## 2023-02-13 DIAGNOSIS — Z1231 Encounter for screening mammogram for malignant neoplasm of breast: Secondary | ICD-10-CM

## 2023-03-22 ENCOUNTER — Ambulatory Visit (RURAL_HEALTH_CENTER): Payer: Commercial Managed Care - PPO | Attending: INTERNAL MEDICINE | Admitting: INTERNAL MEDICINE

## 2023-03-22 ENCOUNTER — Encounter (RURAL_HEALTH_CENTER): Payer: Self-pay | Admitting: INTERNAL MEDICINE

## 2023-03-22 ENCOUNTER — Other Ambulatory Visit: Payer: Self-pay

## 2023-03-22 VITALS — BP 158/90 | HR 74 | Resp 16 | Ht 67.0 in | Wt 221.5 lb

## 2023-03-22 DIAGNOSIS — F419 Anxiety disorder, unspecified: Secondary | ICD-10-CM | POA: Insufficient documentation

## 2023-03-22 DIAGNOSIS — B379 Candidiasis, unspecified: Secondary | ICD-10-CM | POA: Insufficient documentation

## 2023-03-22 DIAGNOSIS — R519 Headache, unspecified: Secondary | ICD-10-CM

## 2023-03-22 DIAGNOSIS — J3089 Other allergic rhinitis: Secondary | ICD-10-CM | POA: Insufficient documentation

## 2023-03-22 MED ORDER — TERCONAZOLE 80 MG VAGINAL SUPPOSITORY
1.0000 | Freq: Every evening | VAGINAL | 0 refills | Status: AC
Start: 2023-03-22 — End: 2023-04-01

## 2023-03-22 MED ORDER — MOMETASONE 50 MCG/ACTUATION NASAL SPRAY
2.0000 | Freq: Every day | NASAL | 1 refills | Status: DC
Start: 2023-03-22 — End: 2024-05-24

## 2023-03-22 MED ORDER — IPRATROPIUM BROMIDE 42 MCG (0.06 %) NASAL SPRAY
2.0000 | Freq: Three times a day (TID) | NASAL | 1 refills | Status: DC
Start: 2023-03-22 — End: 2024-05-24

## 2023-03-22 NOTE — Nursing Note (Signed)
Patient is here with c/o throat pain and pain behind her right eye for 1 month.  She denies any fever, falls or injuries.

## 2023-03-22 NOTE — Progress Notes (Addendum)
[facility]  [address]  [place], [place]  [address]  Phone: [phone] Fax: [phone]    Name: Jocelyn Le                       Date of Birth: [birth year]   MRN:  [id number]                         Date of visit: 03/22/2023     Chief Complaint: Throat Symptoms (Pain in throat and behind right eye x 1 month)    Current Outpatient Medications   Medication Sig    clotrimazole (MYCELEX) 10 mg Mucous Membrane Troche Take 1 Troche (10 mg total) by mouth Five times a day    ipratropium bromide (ATROVENT) 42 mcg (0.06 %) Nasal Spray, Non-Aerosol Administer 2 Sprays into affected nostril(s) Three times a day    mometasone (NASONEX) 50 mcg/actuation Nasal Spray, Non-Aerosol Administer 2 Sprays into affected nostril(s) Once a day    multivitamin with iron Oral Tablet Take 1 Tablet by mouth Once a day    nystatin (MYCOSTATIN) 100,000 unit/mL Oral Suspension Take 5 mL by mouth Four times a day    terconazole (TERAZOL 3) 80 mg Vaginal Suppository Insert 1 Suppository (80 mg total) into the vagina Every night for 10 days       Patient Active Problem List    Diagnosis Date Noted    Benign hypertension 01/10/2023    Candidiasis of vagina 10/23/2022       Subjective:   Patient is here for acute visit.    Complaining of pain behind right eye, and occasionally behind left eye. Ongoing about a month.  Also complaining of sore throat. Right side, mid way down throat. When she was taking the nystatin swish and swallow it used to help, but now it is not helping.   Having a lot sinus drainage and having to keep clearing throat.   Extremely worried that symptoms may be coming from the Candida glabrata infection she was previously diagnosed with. Patient tearful. States she feels like she is going to die.     States she is very anxious over these medical issues.     ROS:  10 systems reviewed and were negative except as noted.     Objective :  BP (!) 158/90 (Site: Right Arm, Patient Position: Sitting, Cuff Size: Adult)    Pulse 74   Resp 16   Ht 1.702 m (5\' 7" )   Wt 100 kg (221 lb 8 oz)   SpO2 100%   BMI 34.69 kg/m       General:  appears in good health, comfortable  Lungs:  Clear to auscultation bilaterally.   Cardiovascular:  regular rate and rhythm, S1, S2 normal, no murmur, click, rub or gallop  Neurologic:  Grossly normal. Alert and oriented x3  Psychiatric:  Anxious      Data reviewed:      Assessment/Plan:  Assessment/Plan   1. Non-seasonal allergic rhinitis, unspecified trigger    2. Anxiety    3. Candida glabrata infection    4. Acute intractable headache, unspecified headache type      Allergies  No evidence of fungal infection on exam  Most likely cause for headache, which would make it sinus in etiology  Try nasonex and ipratroprium  Can refer to ENT if needed    Headache  Most likely allergic in etiology  Expect it to resolve  If does not, will plan on head CT. Possible referral to ENT depending on sinusitis findings.     C glabrata  Try Terconazole  No evidence that lingering symptoms require IV antifungals    Anxiety  Does not want medical treatment  Counseling provided      Terald Sleeper, DO, [facility]  Internal Medicine

## 2023-03-27 ENCOUNTER — Telehealth (RURAL_HEALTH_CENTER): Payer: Self-pay | Admitting: INTERNAL MEDICINE

## 2023-03-27 ENCOUNTER — Other Ambulatory Visit (RURAL_HEALTH_CENTER): Payer: Self-pay | Admitting: INTERNAL MEDICINE

## 2023-03-27 DIAGNOSIS — R519 Headache, unspecified: Secondary | ICD-10-CM

## 2023-03-27 NOTE — Telephone Encounter (Signed)
We will go ahead and get a CT.

## 2023-03-27 NOTE — Telephone Encounter (Signed)
Patient called to report she has been having night sweats for 1 month. She stated that it is related to the other symptoms she had when she was seen last Thursday.

## 2023-03-27 NOTE — Telephone Encounter (Signed)
Patient was notified.

## 2023-03-29 ENCOUNTER — Telehealth (RURAL_HEALTH_CENTER): Payer: Self-pay | Admitting: INTERNAL MEDICINE

## 2023-03-29 ENCOUNTER — Other Ambulatory Visit (RURAL_HEALTH_CENTER): Payer: Self-pay | Admitting: INTERNAL MEDICINE

## 2023-03-29 DIAGNOSIS — B379 Candidiasis, unspecified: Secondary | ICD-10-CM

## 2023-03-29 DIAGNOSIS — B3731 Acute candidiasis of vulva and vagina: Secondary | ICD-10-CM

## 2023-03-29 NOTE — Telephone Encounter (Signed)
Advised pt that the dr Lowella Grip refill the suppositories anymore due to not being appropriate treatment. Advised the dr can do a referral to an ID dr if she would like. She said ok and said do the referral and then said she wont be coming back here. I also advised pt the ct was pending insurance approval and when i heard back from them i would let her know. I asked pt if she wanted me to cancel her future appt here and make note she was finding a different PCP she said no to leave the appt for now  .    She would like referral to ID dr

## 2023-04-04 ENCOUNTER — Other Ambulatory Visit (RURAL_HEALTH_CENTER): Payer: Self-pay | Admitting: INTERNAL MEDICINE

## 2023-04-04 DIAGNOSIS — R519 Headache, unspecified: Secondary | ICD-10-CM

## 2023-04-09 ENCOUNTER — Telehealth (RURAL_HEALTH_CENTER): Payer: Self-pay | Admitting: INTERNAL MEDICINE

## 2023-04-09 IMAGING — MR MRI BRAIN WITHOUT AND WITH CONTRAST
8 of 9 series · 36 of 48 positions shown · non-contrast
Comparison: None available.

﻿EXAM:  [id number]   MRI BRAIN W/O CONTRAST
INDICATION: 52-year-old female with persistent headache.  No history of trauma or malignancy.
TECHNIQUE: Axial, coronal and sagittal images were obtained without IV contrast.  Patient declined permission for contrast enhanced series.

[Series 5: DWI · axial · 5.5mm · 1.35mm/px · z∈[-36,+105]mm · 10 of 96 slices shown (1 of 3)]
[im 7/96]
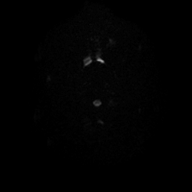
[im 13/96]
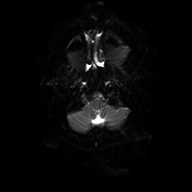
[im 20/96]
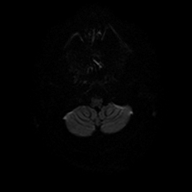
[im 32/96]
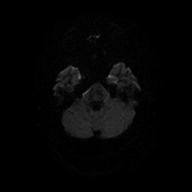
[im 45/96]
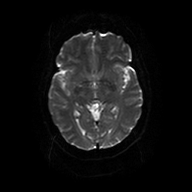
[im 51/96]
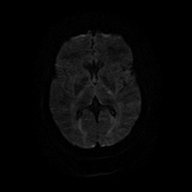
[im 58/96]
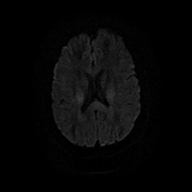
[im 70/96]
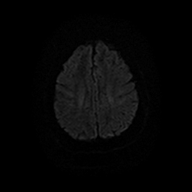
[im 83/96]
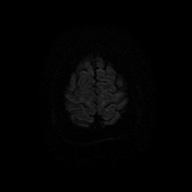
[im 96/96]
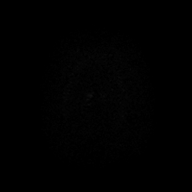

[Series 6: DWI · axial · 5.5mm · 1.35mm/px · z∈[-43,+105]mm · 4 of 24 slices shown (2 of 3)]
[im 1/24]
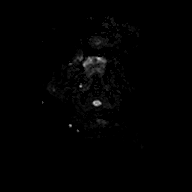
[im 8/24]
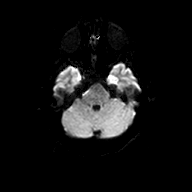
[im 16/24]
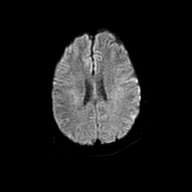
[im 24/24]
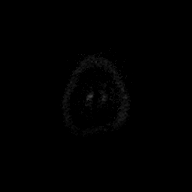

[Series 7: DWI · axial · 5.5mm · 1.35mm/px · z∈[-43,+105]mm · 4 of 24 slices shown (3 of 3)]
[im 1/24]
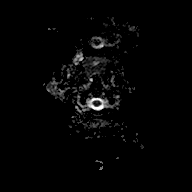
[im 8/24]
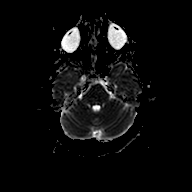
[im 16/24]
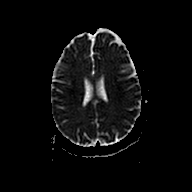
[im 24/24]
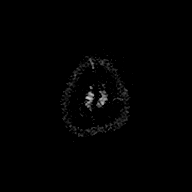

[Series 8: T2 · axial · 5.5mm · 0.43mm/px · z∈[-37,+111]mm · 4 of 24 slices shown (1 of 2)]
[im 1/24]
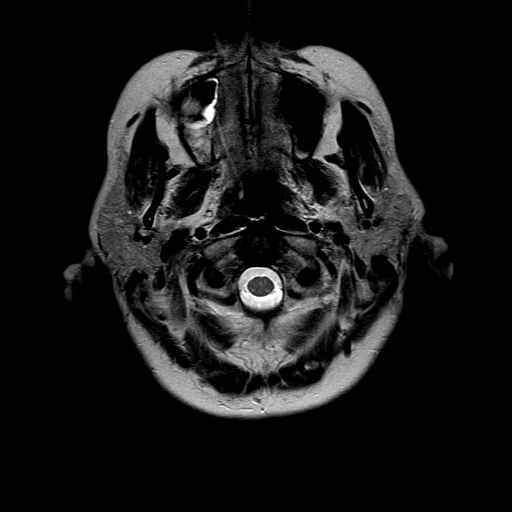
[im 8/24]
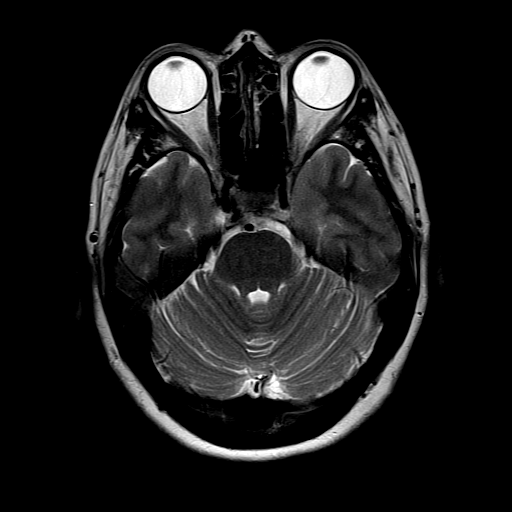
[im 16/24]
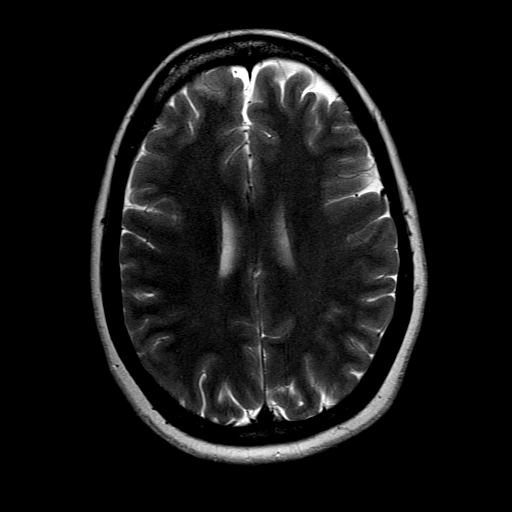
[im 24/24]
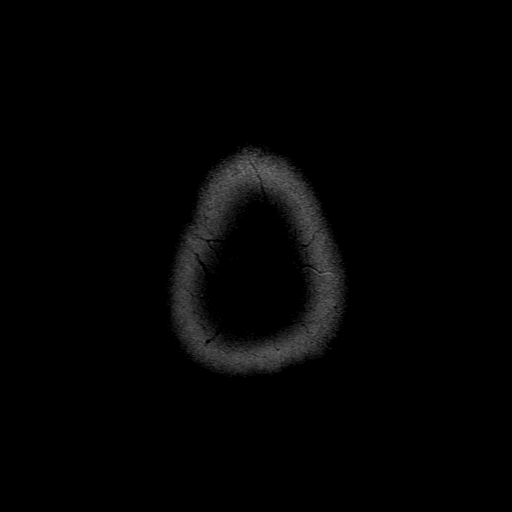

[Series 10: FLAIR · axial · 5.5mm · 0.43mm/px · z∈[-39,+115]mm · 4 of 25 slices shown (1 of 2)]
[im 1/25]
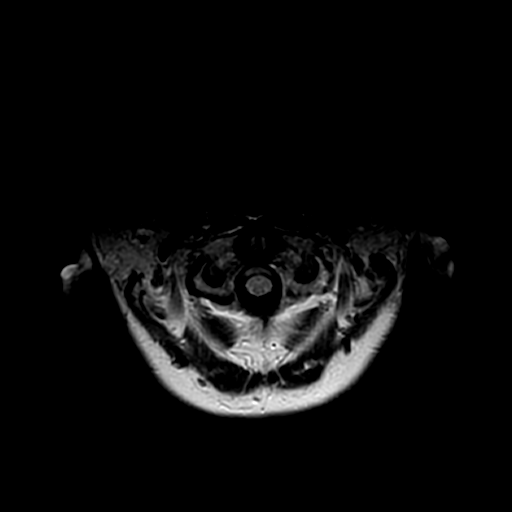
[im 9/25]
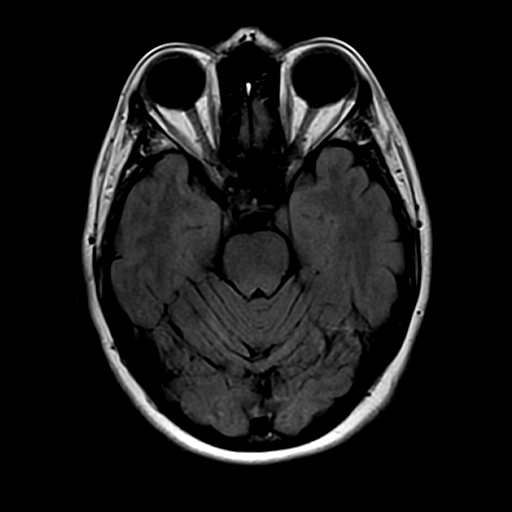
[im 17/25]
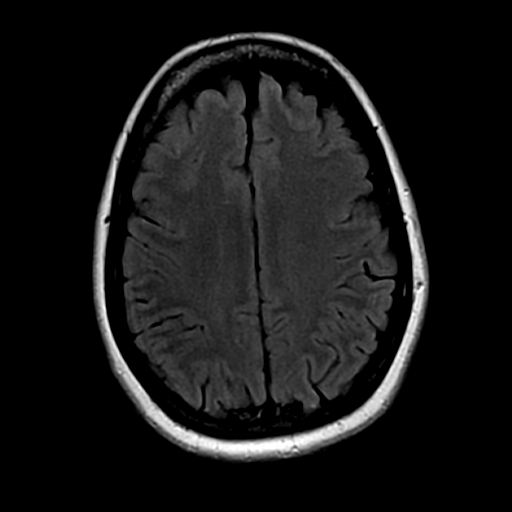
[im 25/25]
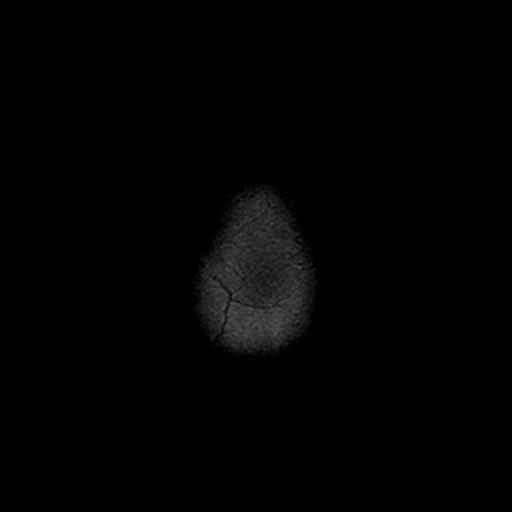

[Series 11: T1 · axial · 5.5mm · 0.43mm/px · z∈[-41,+11]mm · 2 of 25 slices shown]
[im 1/25]
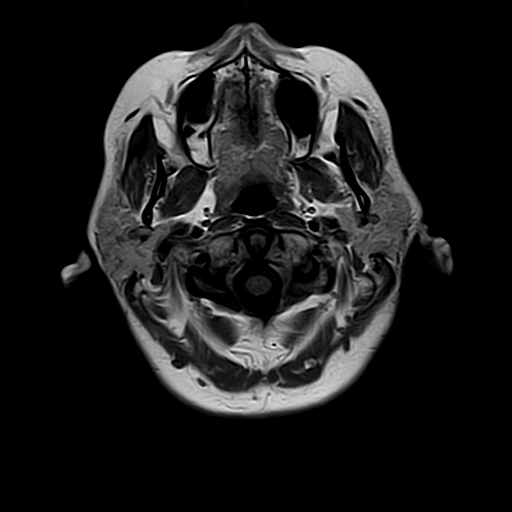
[im 9/25]
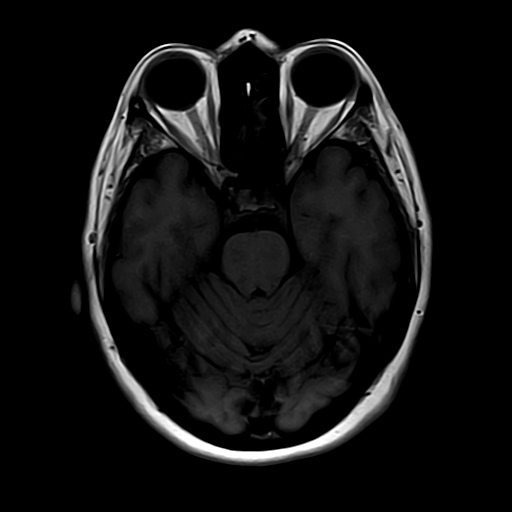

[Series 12: FLAIR · sagittal · 5.0mm · 0.75mm/px · 4 of 23 slices shown (2 of 2)]
[im 1/23]
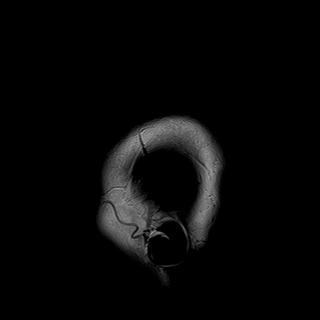
[im 8/23]
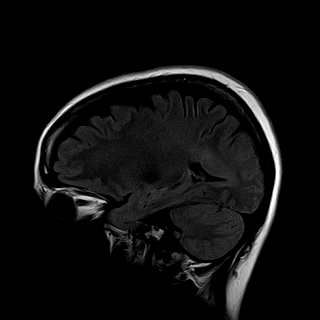
[im 15/23]
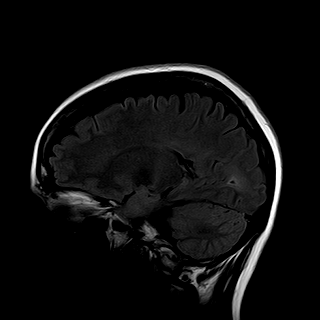
[im 23/23]
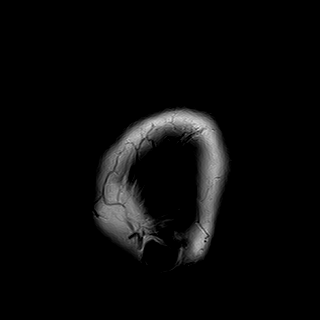

[Series 13: T2 · coronal · 6.3mm · 0.43mm/px · 4 of 25 slices shown (2 of 2)]
[im 1/25]
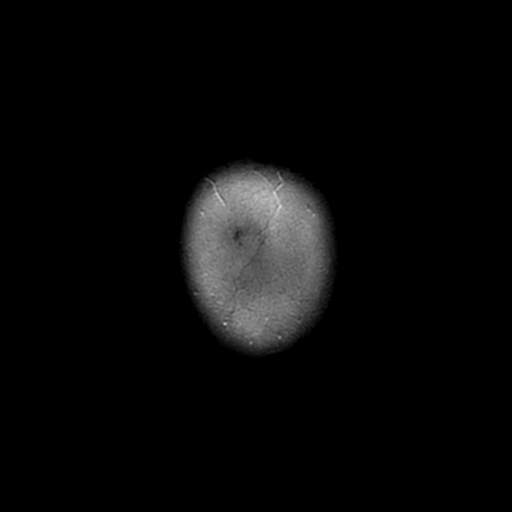
[im 9/25]
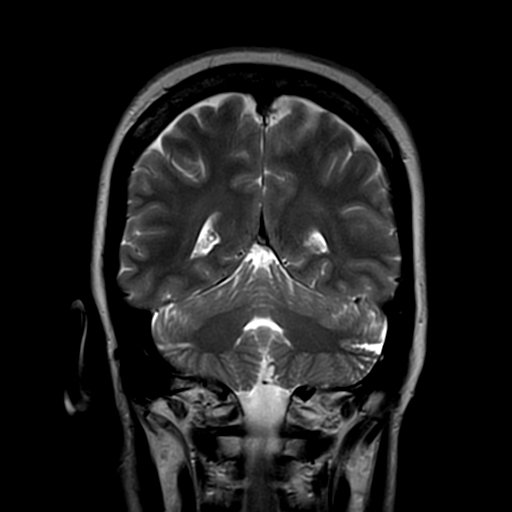
[im 17/25]
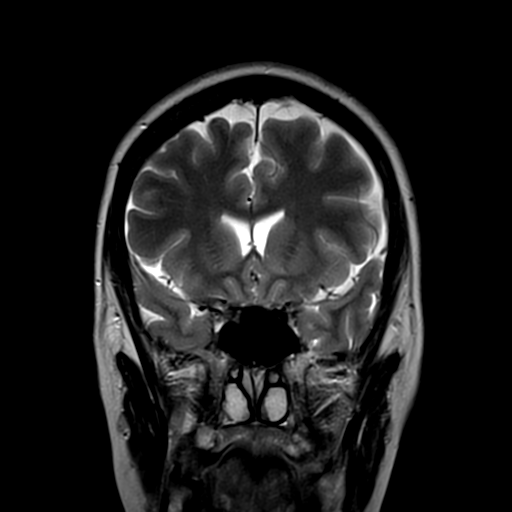
[im 25/25]
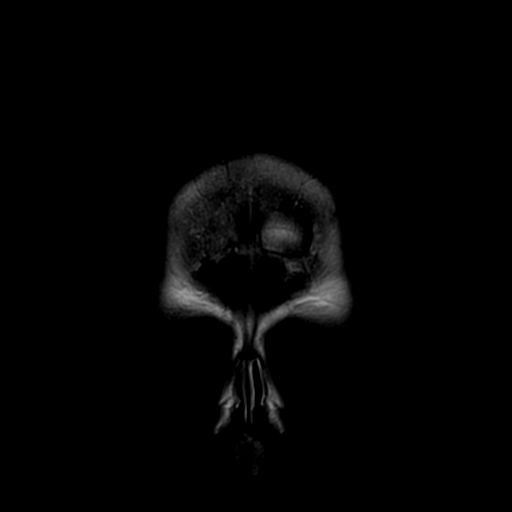

[36 of 48 positions shown; findings below may reference images not displayed]

FINDINGS: No acute ischemia on diffusion sequence.  No evidence of intracranial bleed or extra-axial collections are seen.  No evidence of ventriculomegaly or midline shift.  Major arteries of circle of Willis and dural venous sinuses are patent.

On FLAIR images, there is no evidence of demyelinating lesions.  Space-occupying lesions of the posterior fossa.

Paranasal sinuses and mastoids do not show acute findings.
IMPRESSION: 1. No intracranial space-occupying lesions.  No ventriculomegaly or midline shift.

2. There is no evidence of acute ischemia, chronic ischemia or demyelinating lesions of the brain.  

3. Major vascular structures of the circle-of-Willis are patent.

4. Paranasal sinuses and mastoids do not show acute findings.

5. Patient declined IV contrast.

## 2023-04-09 NOTE — Telephone Encounter (Signed)
PT WAS NOTIFIED.

## 2023-04-09 NOTE — Telephone Encounter (Signed)
MRI did not show any masses, lesions, sinus infections, or anything else to suggest that patient has any type of fungal infection in her head.  My recommendation remains to see infectious disease over her continued symptoms.

## 2023-04-11 ENCOUNTER — Encounter (INDEPENDENT_AMBULATORY_CARE_PROVIDER_SITE_OTHER): Payer: Self-pay

## 2023-04-11 ENCOUNTER — Ambulatory Visit (INDEPENDENT_AMBULATORY_CARE_PROVIDER_SITE_OTHER): Payer: Self-pay | Admitting: Physician Assistant

## 2023-04-13 ENCOUNTER — Ambulatory Visit (RURAL_HEALTH_CENTER): Payer: Self-pay | Admitting: INTERNAL MEDICINE

## 2023-04-28 ENCOUNTER — Other Ambulatory Visit: Payer: Self-pay

## 2023-04-28 ENCOUNTER — Encounter (HOSPITAL_BASED_OUTPATIENT_CLINIC_OR_DEPARTMENT_OTHER): Payer: Self-pay

## 2023-04-28 ENCOUNTER — Emergency Department
Admission: EM | Admit: 2023-04-28 | Discharge: 2023-04-28 | Disposition: A | Discharge status: Home / Self Care | Payer: 59 | Attending: NURSE PRACTITIONER | Admitting: NURSE PRACTITIONER

## 2023-04-28 DIAGNOSIS — Z23 Encounter for immunization: Secondary | ICD-10-CM | POA: Insufficient documentation

## 2023-04-28 DIAGNOSIS — S60459A Superficial foreign body of unspecified finger, initial encounter: Secondary | ICD-10-CM

## 2023-04-28 DIAGNOSIS — W458XXA Other foreign body or object entering through skin, initial encounter: Secondary | ICD-10-CM | POA: Insufficient documentation

## 2023-04-28 DIAGNOSIS — S60454A Superficial foreign body of right ring finger, initial encounter: Secondary | ICD-10-CM | POA: Insufficient documentation

## 2023-04-28 MED ORDER — DIPHTH,PERTUSSIS(ACEL),TETANUS 2.5 LF UNIT-8 MCG-5 LF/0.5ML IM SYRINGE
INJECTION | INTRAMUSCULAR | Status: AC
Start: 2023-04-28 — End: 2023-04-28
  Filled 2023-04-28: qty 0.5

## 2023-04-28 MED ORDER — MUPIROCIN 2 % TOPICAL OINTMENT
TOPICAL_OINTMENT | Freq: Three times a day (TID) | CUTANEOUS | 0 refills | Status: AC
Start: 2023-04-28 — End: 2023-05-05

## 2023-04-28 MED ORDER — LIDOCAINE HCL 10 MG/ML (1 %) INJECTION SOLUTION
INTRAMUSCULAR | Status: AC
Start: 2023-04-28 — End: 2023-04-28
  Filled 2023-04-28: qty 20

## 2023-04-28 MED ORDER — LIDOCAINE HCL 10 MG/ML (1 %) INJECTION SOLUTION
15.0000 mL | INTRAMUSCULAR | Status: AC
Start: 2023-04-28 — End: 2023-04-28
  Administered 2023-04-28: 150 mg via INTRADERMAL

## 2023-04-28 MED ORDER — MUPIROCIN 2 % TOPICAL OINTMENT
TOPICAL_OINTMENT | CUTANEOUS | Status: AC
Start: 2023-04-28 — End: 2023-04-28
  Filled 2023-04-28: qty 22

## 2023-04-28 MED ORDER — MUPIROCIN 2 % TOPICAL OINTMENT
TOPICAL_OINTMENT | Freq: Once | CUTANEOUS | Status: DC
Start: 2023-04-28 — End: 2023-04-28

## 2023-04-28 MED ORDER — DIPHTH,PERTUSSIS(ACEL),TETANUS 2.5 LF UNIT-8 MCG-5 LF/0.5ML IM SYRINGE
0.5000 mL | INJECTION | INTRAMUSCULAR | Status: AC
Start: 2023-04-28 — End: 2023-04-28
  Administered 2023-04-28: 0.5 mL via INTRAMUSCULAR

## 2023-04-28 NOTE — ED Triage Notes (Signed)
Pt states she was cleaning a door at home when she got a foreign body in the right 4th digit prior to coming here.

## 2023-04-28 NOTE — Discharge Instructions (Signed)
Please keep wound clean dry and covered    Please apply antibiotic ointment 3 times a day    Please follow-up with your primary care

## 2023-04-28 NOTE — ED Provider Notes (Signed)
Emergency Department  [facility]   04/28/2023     Anju Haithcock  March 25, 1971  52 y.o.  female  [place] [address]   [phone] (home)  PCP: Mardee Postin [place], [place]   [id number]    Date of service:04/28/2023 15:43    Chief Complaint:   Chief Complaint   Patient presents with    Finger Injury       Arrival: The patient arrived by Car and is alone    HPI:  This is a 52 year old female who comes into the emergency room with a complaint of a splinter embedded deeply into the right 4th digit.  Patient has no other complaints.  She was unable to remove the splinter herself.  She had no one at home to help her.  She looks acutely while at the bedside with no signs and symptoms of respiratory or cardiovascular distress.      Past Medical History:   Past Medical History:   Diagnosis Date    Wears glasses        Past Surgical History:   Past Surgical History:   Procedure Laterality Date    Hx cholecystectomy      Hx wisdom teeth extraction         Social History:   Social History     Tobacco Use    Smoking status: Former     Current packs/day: 0.00     Types: Cigarettes     Quit date: 2023     Years since quitting: 1.9    Smokeless tobacco: Never   Vaping Use    Vaping status: Never Used   Substance Use Topics    Alcohol use: Not Currently     Comment: rarely    Drug use: Never        Family History:  Family History   Problem Relation Age of Onset    Breast Cancer Mother 71    Heart Attack Father            Medications Prior to Admission       Prescriptions    clotrimazole (MYCELEX) 10 mg Mucous Membrane Troche    Take 1 Troche (10 mg total) by mouth Five times a day    ipratropium bromide (ATROVENT) 42 mcg (0.06 %) Nasal Spray, Non-Aerosol    Administer 2 Sprays into affected nostril(s) Three times a day    mometasone (NASONEX) 50 mcg/actuation Nasal Spray, Non-Aerosol    Administer 2 Sprays into affected nostril(s) Once a day    multivitamin with iron Oral Tablet    Take 1 Tablet by mouth Once a day     nystatin (MYCOSTATIN) 100,000 unit/mL Oral Suspension    Take 5 mL by mouth Four times a day    terconazole (TERAZOL 3) 80 mg Vaginal Suppository    Insert 1 Suppository (80 mg total) into the vagina Every night for 10 days            Allergies:   Allergies   Allergen Reactions    Fluconazole Mental Status Effect    Smallpox Vaccine,Chick-Embryo,Live  Other Adverse Reaction (Add comment)     Patient unsure which vaccine it was and does not know what happened.  States her mom said she about died.       Above history reviewed with patient.  Allergies, medication list, reviewed.        Physical exam:  Body mass index is 31.47 kg/m.  ED Triage Vitals [04/28/23 1328]  BP (Non-Invasive) (!) 155/95   Heart Rate 76   Respiratory Rate 17   Temperature 36.5 C (97.7 F)   SpO2 99 %   Weight 88.5 kg (195 lb)   Height 1.676 m (5\' 6" )     Constitutional: patient is oriented to person, place, and time and well-developed, well-nourished, and in no distress.   HENT:   Head: Normocephalic and atraumatic.   Right Ear: External ear normal.   Left Ear: External ear normal.   Nose: Nose normal.   Mouth/Throat: Oropharynx is clear and moist.   Eyes: Pupils are equal, round, and reactive to light. Conjunctivae and EOM are normal.   Neck: Normal range of motion. Neck supple.   Cardiovascular: Normal rate, regular rhythm, normal heart sounds and intact distal pulses.   Pulmonary/Chest: Effort normal and breath sounds normal.   Abdominal: Soft. Bowel sounds are normal.   Musculoskeletal: Normal range of motion.   Neurological:Patient is alert and oriented to person, place, and time.  Gait normal. GCS score is 15.   Skin: Skin is warm and dry.  Splinter approximately 0.2 mm in length imbedded into the tip of the 4th right digit  Psychiatric: Mood, memory, affect and judgment normal.      The following orders were placed after examining the patient :  Orders Placed This Encounter    BEDSIDE  FOREIGN BODY REMOVAL    BEDSIDE  FOREIGN BODY  REMOVAL    diphtheria, pertussis-acell, tetanus (BOOSTRIX) IM injection    lidocaine 1% injection    mupirocin (BACTROBAN) 2 % Ointment      No orders to display      No results found for this or any previous visit (from the past 12 hour(s)).      ED Course:       Medications Administered in the ED   diphtheria, pertussis-acell, tetanus (BOOSTRIX) IM injection (0.5 mL IntraMUSCULAR Given 04/28/23 1420)   lidocaine 1% injection (150 mg Intradermal Given 04/28/23 1422)      FB Removal    Date/Time: 04/29/2023 1:02 PM    Performed by: Bonney Leitz, FNP  Authorized by: Bonney Leitz, FNP    Consent:     Consent obtained:  Verbal    Consent given by:  Patient    Risks, benefits, and alternatives were discussed: yes      Risks discussed:  Bleeding, infection, pain, incomplete removal, nerve damage, poor cosmetic result and worsening of condition    Alternatives discussed:  No treatment and alternative treatment  Universal protocol:     Procedure explained and questions answered to patient or proxy's satisfaction: yes      Relevant documents present and verified: yes      Test results available: yes      Imaging studies available: no      Required blood products, implants, devices, and special equipment available: no      Site/side marked: yes      Immediately prior to procedure, a time out was called: yes      Patient identity confirmed:  Verbally with patient  Location:     Location:  Finger    Finger location:  R ring finger    Depth:  Intradermal    Tendon involvement:  None  Pre-procedure details:     Imaging:  None  Anesthesia:     Anesthesia method:  Local infiltration    Local anesthetic:  Lidocaine 1% w/o epi  Procedure type:     Procedure complexity:  Simple (Tweezers used)  Post-procedure details:     Neurovascular status: intact      Confirmation:  No additional foreign bodies on visualization    Skin closure:  None    Dressing:  Antibiotic ointment                 Medical Decision Making  Splinter,    Nursing  notes reviewed.    Medical chart and diagnostic results reviewed.    Attending available for questioning consult.    Emergency department course:       52 year old female who came into the emergency room with a splinter imbedded into her tip of her 4th right finger, she was unable to remove it at home.  She was very anxious about the removal, she was numbed appropriately and removal of splinter in whole piece.  Tolerated well.    Patient was given antibacterial ointment, she was educated to watch for signs and symptoms of infection.  She should follow up with her primary care.  She should keep the area clean and dry she verbalized understanding.  At this time patient is safe and stable for discharge home with strict return instructions  I have discussed and counseled the patient and available family on the patient's condition, test results, treatment, and likely expected clinical course.  Patient was counseled on supportive care at home.  All questions and concerns were addressed at the bedside.  Patient verbalized understanding and is agreeable with the plan.  Patient is always welcome to return to the ER should new or worsening symptoms occur.  At this time patient is safe and stable for discharge home with strict return instructions.     Risk  Prescription drug management.       Findings and diagnosis discussed with patient.  Clinical Impression:   Clinical Impression   Finger, superficial foreign body (splinter), initial encounter (Primary)       Disposition: Discharged      No follow-ups on file.   Discharge Medication List as of 04/28/2023  3:45 PM        START taking these medications    Details   mupirocin (BACTROBAN) 2 % Ointment Apply topically Three times a day for 7 days Apply topically to finger.  Disp-1 Each, R-0  E-Rx            Terald Sleeper, DO  [address]  [place] [address]  [phone]    Go in 3 days  For wound re-check      Future Appointments  Future Appointments   Date  Time Provider Department Center   06/21/2023  8:30 AM McClanahan, Mardee Postin, DO [facility]        BP 138/84   Pulse 88   Temp 36.7 C (98 F)   Resp 16   Ht 1.676 m (5\' 6" )   Wt 88.5 kg (195 lb)   SpO2 100%   BMI 31.47 kg/m        Bonney Leitz, FNP10/06/2023          This note was partially created using voice recognition software and is inherently subject to errors including those of syntax and "sound alike " substitutions which may escape proof reading.  In such instances, original meaning may be extrapolated by contextual derivation.

## 2023-04-28 NOTE — ED Nurses Note (Signed)
Bactroban ointment applied and covered wound with band aid. Patient discharged home with family.  AVS reviewed with patient/care giver.  A written copy of the AVS and discharge instructions was given to the patient/care giver.  Questions sufficiently answered as needed.  Patient/care giver encouraged to follow up with PCP as indicated.  In the event of an emergency, patient/care giver instructed to call 911 or go to the nearest emergency room.

## 2023-04-28 NOTE — ED Nurses Note (Signed)
Right hand soaking in warm water and soap.

## 2023-04-30 ENCOUNTER — Telehealth (RURAL_HEALTH_CENTER): Payer: Self-pay | Admitting: INTERNAL MEDICINE

## 2023-06-04 NOTE — Procedures (Signed)
FB Removal    Date/Time: 06/04/2023 10:24 AM    Performed by: Bonney Leitz, FNP  Authorized by: Bonney Leitz, FNP    Consent:     Consent obtained:  Verbal    Consent given by:  Patient    Risks discussed:  Bleeding, infection, pain, worsening of condition and incomplete removal  Universal protocol:     Procedure explained and questions answered to patient or proxy's satisfaction: yes      Relevant documents present and verified: no      Test results available: no      Imaging studies available: no      Required blood products, implants, devices, and special equipment available: no      Site/side marked: no      Immediately prior to procedure, a time out was called: no      Patient identity confirmed:  Verbally with patient  Location:     Location:  Finger    Finger location:  R ring finger    Depth:  Intradermal    Tendon involvement:  None  Pre-procedure details:     Imaging:  None  Anesthesia:     Anesthesia method:  None  Procedure type:     Procedure complexity:  Simple (Removed with tweezers)  Procedure details:     Incision length:  0    Foreign bodies recovered:  1    Description:  Wood splinter    Intact foreign body removal: yes    Post-procedure details:     Confirmation:  No additional foreign bodies on visualization    Skin closure:  None    Dressing:  Open (no dressing)    Procedure completion:  Tolerated  Comments:      Small wood splinter approximately 0.2 cm removed with tweezers      Bonney Leitz, FNP   11/18/202410:19

## 2023-06-21 ENCOUNTER — Ambulatory Visit (RURAL_HEALTH_CENTER): Payer: Self-pay | Admitting: INTERNAL MEDICINE

## 2023-07-01 ENCOUNTER — Other Ambulatory Visit: Payer: Self-pay

## 2023-07-01 ENCOUNTER — Emergency Department
Admission: EM | Admit: 2023-07-01 | Discharge: 2023-07-01 | Disposition: A | Discharge status: Home / Self Care | Payer: 59 | Attending: FAMILY PRACTICE | Admitting: FAMILY PRACTICE

## 2023-07-01 ENCOUNTER — Encounter (HOSPITAL_BASED_OUTPATIENT_CLINIC_OR_DEPARTMENT_OTHER): Payer: Self-pay

## 2023-07-01 ENCOUNTER — Emergency Department (HOSPITAL_BASED_OUTPATIENT_CLINIC_OR_DEPARTMENT_OTHER): Payer: Commercial Managed Care - PPO

## 2023-07-01 DIAGNOSIS — S61258A Open bite of other finger without damage to nail, initial encounter: Secondary | ICD-10-CM

## 2023-07-01 DIAGNOSIS — S61251A Open bite of left index finger without damage to nail, initial encounter: Secondary | ICD-10-CM | POA: Insufficient documentation

## 2023-07-01 DIAGNOSIS — W5501XA Bitten by cat, initial encounter: Secondary | ICD-10-CM | POA: Insufficient documentation

## 2023-07-01 DIAGNOSIS — Z203 Contact with and (suspected) exposure to rabies: Secondary | ICD-10-CM

## 2023-07-01 MED ORDER — DOXYCYCLINE HYCLATE 100 MG CAPSULE
100.0000 mg | ORAL_CAPSULE | Freq: Two times a day (BID) | ORAL | 0 refills | Status: AC
Start: 2023-07-01 — End: 2023-07-08

## 2023-07-01 MED ORDER — RABIES IMMUNE GLOBULIN (PF) 300 UNIT/ML INTRAMUSCULAR SOLUTION
INTRAMUSCULAR | Status: AC
Start: 2023-07-01 — End: 2023-07-01
  Filled 2023-07-01: qty 6

## 2023-07-01 MED ORDER — RABIES VACCINE,HUMAN DIPLOID (PF) 2.5 UNIT INTRAMUSCULAR SOLUTION
INTRAMUSCULAR | Status: AC
Start: 2023-07-01 — End: 2023-07-01
  Filled 2023-07-01: qty 1

## 2023-07-01 MED ORDER — DOXYCYCLINE HYCLATE 100 MG TABLET
ORAL_TABLET | ORAL | Status: AC
Start: 2023-07-01 — End: 2023-07-01
  Filled 2023-07-01: qty 1

## 2023-07-01 MED ORDER — AMOXICILLIN 875 MG-POTASSIUM CLAVULANATE 125 MG TABLET
1.0000 | ORAL_TABLET | ORAL | Status: AC
Start: 2023-07-01 — End: 2023-07-01
  Administered 2023-07-01: 1 via ORAL

## 2023-07-01 MED ORDER — AMOXICILLIN 875 MG-POTASSIUM CLAVULANATE 125 MG TABLET
ORAL_TABLET | ORAL | Status: AC
Start: 2023-07-01 — End: 2023-07-01
  Filled 2023-07-01: qty 1

## 2023-07-01 MED ORDER — AMOXICILLIN 875 MG-POTASSIUM CLAVULANATE 125 MG TABLET
1.0000 | ORAL_TABLET | Freq: Two times a day (BID) | ORAL | 0 refills | Status: AC
Start: 2023-07-01 — End: 2023-07-11

## 2023-07-01 MED ORDER — DOXYCYCLINE HYCLATE 100 MG TABLET
100.0000 mg | ORAL_TABLET | ORAL | Status: AC
Start: 2023-07-01 — End: 2023-07-01
  Administered 2023-07-01: 100 mg via ORAL

## 2023-07-01 MED ORDER — RABIES IMMUNE GLOBULIN (PF) 300 UNIT/ML INTRAMUSCULAR SOLUTION
20.0000 [IU]/kg | Freq: Once | INTRAMUSCULAR | Status: DC
Start: 2023-07-01 — End: 2023-07-01
  Administered 2023-07-01: 0 [IU]

## 2023-07-01 MED ORDER — RABIES VACCINE,HUMAN DIPLOID (PF) 2.5 UNIT INTRAMUSCULAR SOLUTION
2.5000 [IU] | Freq: Once | INTRAMUSCULAR | Status: DC
Start: 2023-07-01 — End: 2023-07-01
  Administered 2023-07-01: 0 [IU] via INTRAMUSCULAR

## 2023-07-01 NOTE — ED Nurses Note (Signed)
In room to administer patients rabies vaccinations, patient stating at this time "i need to look these vaccines up more to find out the side effects and what is going to happen to me if i take it." I discussed with patient benefits and risks of rabies vaccinates. Patient requesting more time to make her decision at this time.

## 2023-07-01 NOTE — ED Nurses Note (Signed)
Patient concerned about admin of rabies vaccine. States she would like time to think it over. Dr. Cristela Felt at bedside providing education.

## 2023-07-01 NOTE — ED Provider Notes (Addendum)
[facility], [facility] - Emergency Department  ED Primary Provider Note  History of Present Illness   Chief Complaint   Patient presents with   . Animal Bite     Jocelyn Le is a 52 y.o. female who had concerns including Animal Bite.  Arrival: The patient arrived by Car    This 51 year old female patient presents emergency department after being bitten by a stray cat this afternoon, just prior to arrival here in the ER.  She does have a bite on the left index finger, ulnar aspect of the 1st phalanx.  She has good sensory and motor distally, presents here with bleeding controlled your wrist some bruising and swelling noted of the proximal phalanx of the 2nd left digit.  The CT was a stray has been around for about 6 months, unable to be touched, she put gloves onto capture the cat, and the cat bit her through the glove.  Tetanus is up-to-date.    Patient states she has some sort of Candida infection that is resistant to most antifungals, states she had a mental reaction to taking fluconazole for treatment of this infection.    She has a history of elevated blood pressure as well and seasonal allergies.    Long discussion with this patient about the vaccine and immunoglobulin versus no vaccine and rabies.  Rabies is fatal if contracted, recommended that since this was a stray animal bite that she be vaccinated.  Orders placed, patient will have to sign treatment declined forearm should she decline the rabies post exposure treatment and care.      Animal Bite      History Reviewed This Encounter:  Patient's past medical, surgical, social history reviewed noted.    Physical Exam   ED Triage Vitals [07/01/23 1806]   BP (Non-Invasive) (!) 150/100   Heart Rate (!) 104   Respiratory Rate 16   Temperature 36.3 C (97.3 F)   SpO2 100 %   Weight 88.5 kg (195 lb)   Height 1.676 m (5\' 6" )     Physical Exam  General: No acute distress nontoxic   Left index finger:  She has a linear your white [name] along the  ulnar aspect of the left index finger, proximal phalanx with surrounding swelling, and a bruise noted.  Neurovascular: Capillary refill is brisk, sensory is good distal to the injury.    Patient Data   Labs Ordered/Reviewed - No data to display  No orders to display     Medical Decision Making        Medical Decision Making  Risk  Prescription drug management.      ED Course as of 07/01/23 2000   Sun Jul 01, 2023   1610 Long discussion with the patient regarding rabies vaccination, nursing did go over rabies rabies vaccination with the patient again as well, patient is refusing.  Treatment with fusiform signed, patient was given antimicrobial treatment and therapy, was advised she has 72 hours or less to follow-up should she change her mind for rabies post exposure prophylactic treatment.   1959 Patient was discharged, then decided she went take the rabies immunoglobulin and vaccine, she then declined and walked out without signing out and refused the rabies post exposure treatment.            Medications Administered in the ED   rabies immune globulin (PF) 300 units/mL IM injection (0 Units Infiltration Not Given 07/01/23 1930)   rabies virus vaccine (IMOVAX) IM injection (0  Units IntraMUSCULAR Not Given 07/01/23 1930)   amoxicillin-clavulanate (AUGMENTIN) 875-125mg  per tablet (1 Tablet Oral Given 07/01/23 1907)   doxycycline tablet (100 mg Oral Given 07/01/23 1907)     Clinical Impression   Animal bite of index finger, initial encounter (Primary)       Disposition: Discharged

## 2023-07-01 NOTE — ED Triage Notes (Addendum)
Bite by stray cat to lt index finger pta

## 2023-07-01 NOTE — ED Nurses Note (Signed)

## 2023-07-01 NOTE — ED Nurses Note (Signed)
In to room to speak with patient about hr decision on the rabies vaccination. Patient stating "I think the chances of the cat having rabies is rare, I don't want to take a chance of something happening to me because of the shot." Refusal paperwork signed by patient, MD, and this RN. Patient asking "if i change my mind and decide to come back to get it how long do i have. Patient informed that she has 72 hours or less to have rabies vaccination. Patient agreeable and understanding at this time. No further concerns or complaints noted.

## 2023-07-01 NOTE — ED Nurses Note (Signed)
Patient refusing rabies vaccination again at this time. Patient leaving with discharge paperwork previously given. Explained to patient again that she has only 72 hours to receive vaccination if she changes her mind. Patient verbalizes understanding.

## 2023-07-01 NOTE — ED Nurses Note (Signed)
Patient up to nurses desk after being discharged, states that she decided that she wants her rabies vaccine now. MD notified.

## 2023-07-01 NOTE — Discharge Instructions (Signed)
You have been given doxycycline and Augmentin as antibiotics to protect against and treat for infection from an animal bite and cat scratch fever.    You have declined rabies immunoglobulin and vaccine series this evening, you have 72 hours to initiate this vaccine series 2 greatly reduce your risk of contracting rabies should this cat have rabies.  Should the cat has rabies you have a very high likelihood of getting rabies and and this will be fatal.

## 2023-07-20 ENCOUNTER — Other Ambulatory Visit: Payer: Self-pay

## 2023-10-24 ENCOUNTER — Other Ambulatory Visit: Payer: Self-pay

## 2023-10-24 ENCOUNTER — Emergency Department (HOSPITAL_BASED_OUTPATIENT_CLINIC_OR_DEPARTMENT_OTHER)

## 2023-10-24 ENCOUNTER — Encounter (HOSPITAL_BASED_OUTPATIENT_CLINIC_OR_DEPARTMENT_OTHER): Payer: Self-pay

## 2023-10-24 ENCOUNTER — Emergency Department
Admission: EM | Admit: 2023-10-24 | Discharge: 2023-10-24 | Disposition: A | Discharge status: Home / Self Care | Attending: Family | Admitting: Family

## 2023-10-24 DIAGNOSIS — Z1632 Resistance to antifungal drug(s): Secondary | ICD-10-CM | POA: Insufficient documentation

## 2023-10-24 DIAGNOSIS — Z808 Family history of malignant neoplasm of other organs or systems: Secondary | ICD-10-CM | POA: Insufficient documentation

## 2023-10-24 DIAGNOSIS — Z8619 Personal history of other infectious and parasitic diseases: Secondary | ICD-10-CM | POA: Insufficient documentation

## 2023-10-24 DIAGNOSIS — N760 Acute vaginitis: Secondary | ICD-10-CM | POA: Insufficient documentation

## 2023-10-24 DIAGNOSIS — Z9049 Acquired absence of other specified parts of digestive tract: Secondary | ICD-10-CM | POA: Insufficient documentation

## 2023-10-24 DIAGNOSIS — R109 Unspecified abdominal pain: Secondary | ICD-10-CM | POA: Insufficient documentation

## 2023-10-24 HISTORY — DX: Candidiasis, unspecified: B37.9

## 2023-10-24 LAB — CBC WITH DIFF
BASOPHIL #: 0.04 10*3/uL (ref 0.00–0.10)
BASOPHIL %: 0 % (ref 0–1)
EOSINOPHIL #: 0.43 10*3/uL (ref 0.00–0.50)
EOSINOPHIL %: 5 % (ref 1–7)
HCT: 46.9 % — ABNORMAL HIGH (ref 31.2–41.9)
HGB: 16.4 g/dL — ABNORMAL HIGH (ref 10.9–14.3)
LYMPHOCYTE #: 3.25 10*3/uL — ABNORMAL HIGH (ref 1.10–3.10)
LYMPHOCYTE %: 35 % (ref 16–46)
MCH: 32.6 pg (ref 24.7–32.8)
MCHC: 35 g/dL (ref 32.3–35.6)
MCV: 93.1 fL (ref 75.5–95.3)
MONOCYTE #: 0.51 10*3/uL (ref 0.20–0.90)
MONOCYTE %: 5 % (ref 4–11)
MPV: 7.8 fL — ABNORMAL LOW (ref 7.9–10.8)
NEUTROPHIL #: 5.09 10*3/uL (ref 1.90–8.20)
NEUTROPHIL %: 55 % (ref 43–77)
PLATELETS: 197 10*3/uL (ref 140–440)
RBC: 5.04 10*6/uL — ABNORMAL HIGH (ref 3.63–4.92)
RDW: 15.3 % (ref 12.3–17.7)
WBC: 9.3 10*3/uL (ref 3.8–11.8)

## 2023-10-24 LAB — COMPREHENSIVE METABOLIC PANEL, NON-FASTING
ALBUMIN/GLOBULIN RATIO: 1 (ref 0.8–1.4)
ALBUMIN: 3.8 g/dL (ref 3.4–5.0)
ALKALINE PHOSPHATASE: 76 U/L (ref 46–116)
ALT (SGPT): 16 U/L (ref <=78)
ANION GAP: 8 mmol/L (ref 4–13)
AST (SGOT): 12 U/L — ABNORMAL LOW (ref 15–37)
BILIRUBIN TOTAL: 0.6 mg/dL (ref 0.2–1.0)
BUN/CREA RATIO: 10
BUN: 10 mg/dL (ref 7–18)
CALCIUM, CORRECTED: 9.3 mg/dL
CALCIUM: 9.1 mg/dL (ref 8.5–10.1)
CHLORIDE: 106 mmol/L (ref 98–107)
CO2 TOTAL: 26 mmol/L (ref 21–32)
CREATININE: 1.01 mg/dL (ref 0.55–1.02)
ESTIMATED GFR: 67 mL/min/{1.73_m2} (ref >59)
GLOBULIN: 3.7
GLUCOSE: 102 mg/dL (ref 74–106)
OSMOLALITY, CALCULATED: 279 mosm/kg (ref 270–290)
POTASSIUM: 4.2 mmol/L (ref 3.5–5.1)
PROTEIN TOTAL: 7.5 g/dL (ref 6.4–8.2)
SODIUM: 140 mmol/L (ref 136–145)

## 2023-10-24 LAB — URINALYSIS, MACRO/MICRO
BILIRUBIN: NEGATIVE mg/dL
BLOOD: NEGATIVE mg/dL
GLUCOSE: NEGATIVE mg/dL
KETONES: NEGATIVE mg/dL
LEUKOCYTES: NEGATIVE WBCs/uL
NITRITE: NEGATIVE
PH: 6 (ref 4.6–8.0)
PROTEIN: NEGATIVE mg/dL
SPECIFIC GRAVITY: 1.01 (ref 1.003–1.035)
UROBILINOGEN: 0.2 mg/dL (ref 0.2–1.0)

## 2023-10-24 LAB — LIPASE: LIPASE: 33 U/L (ref 16–77)

## 2023-10-24 MED ORDER — TERCONAZOLE 80 MG VAGINAL SUPPOSITORY
1.0000 | Freq: Every evening | VAGINAL | 0 refills | Status: AC
Start: 2023-10-24 — End: 2023-11-07

## 2023-10-24 MED ORDER — KETOROLAC 60 MG/2 ML INTRAMUSCULAR SOLUTION
60.0000 mg | INTRAMUSCULAR | Status: DC
Start: 2023-10-24 — End: 2023-10-24

## 2023-10-24 MED ORDER — LIDOCAINE 4 % TOPICAL PATCH
1.0000 | MEDICATED_PATCH | CUTANEOUS | Status: DC
Start: 2023-10-24 — End: 2023-10-24

## 2023-10-24 MED ORDER — ONDANSETRON 4 MG DISINTEGRATING TABLET
4.0000 mg | ORAL_TABLET | ORAL | Status: DC
Start: 2023-10-24 — End: 2023-10-24

## 2023-10-24 NOTE — ED Nurses Note (Signed)
 Patient discharged home with family.  AVS reviewed with patient/care giver.  A written copy of the AVS and discharge instructions was given to the patient/care giver. Scripts handed to patient/care giver. Questions sufficiently answered as needed.  Patient/care giver encouraged to follow up with PCP as indicated.  In the event of an emergency, patient/care giver instructed to call 911 or go to the nearest emergency room.

## 2023-10-24 NOTE — ED Provider Notes (Signed)
 [facility], [facility] - Emergency Department  ED Primary Provider Note  History of Present Illness   Chief Complaint   Patient presents with    Flank Pain     C/O left sided flank pain for the past couple of days. Denies respiratory c/o, GI c/o, or urinary c/o.      Arrival: The patient arrived by Car  Jocelyn Le is a 53 y.o. female who had concerns including Flank Pain.  Interm left flank pain for a few days. States father died of pancreatitic cancer and she is worried about it . Has had gb removed. On going hx of candida with resistance.   Review of Systems   Constitutional: No fever, chills or weakness   Skin: No rash or diaphoresis  HENT: No headaches, or congestion  Eyes: No vision changes or photophobia   Cardio: No chest pain, palpitations or leg swelling   Respiratory: No cough, wheezing or SOB  GI:  No nausea, vomiting or stool changes  GU:  No dysuria, hematuria, or increased frequency+ chronic vag irritation + left flank pain.   MSK: No muscle aches, joint or back pain  Neuro: No seizures, LOC, numbness, tingling, or focal weakness  Psychiatric: No depression, SI or substance abuse  All other systems reviewed and are negative.    History Reviewed This Encounter: all noted and reviewed.     Physical Exam   ED Triage Vitals [10/24/23 1903]   BP (Non-Invasive) (!) 154/90   Heart Rate 85   Respiratory Rate 20   Temperature 37.3 C (99.1 F)   SpO2 98 %   Weight 90.7 kg (200 lb)   Height 1.676 m (5\' 6" )       Constitutional:  53 y.o. female who appears in no distress. Normal color, no cyanosis.   HENT:   Head: Normocephalic and atraumatic.   Mouth/Throat: Oropharynx is clear and moist.   Eyes: EOMI, PERRL   Neck: Trachea midline. Neck supple.  Cardiovascular: RRR, No murmurs, rubs or gallops. Intact distal pulses.  Pulmonary/Chest: BS equal bilaterally. No respiratory distress. No wheezes, rales or chest tenderness.   Abdominal: Bowel sounds present and normal. Abdomen soft, no  tenderness, no rebound and no guarding.  Back: No midline spinal tenderness, no paraspinal tenderness, no CVA tenderness.           Musculoskeletal: No edema, tenderness or deformity.  Skin: warm and dry. No rash, erythema, pallor or cyanosis  Psychiatric: normal mood and affect. Behavior is normal.   Neurological: Patient keenly alert and responsive, easily able to raise eyebrows, facial muscles/expressions symmetric, speaking in fluent sentences, moving all extremities equally and fully, normal gait  Patient Data   XR KUB   Final Result by Edi, Radresults In (04/09 2021)      NEGATIVE KUB.      No renal stone seen on radiograph            Radiologist location ID: [id number]           Medical Decision Making   Diff dx of UTI stone pyelo pancreatitis no concerning lab values xray neg. Ct scan down . Out pt order given for a ct the patient states she can't find a PCP. Seeing tabitha cox pa and had appt June for gyn eval . States only thing that ever helped was terazole vag supp. States she dont want anything for pain. 2 motrin twice  a day controls sx. Refs given.  Clinical Impression   Left flank pain (Primary)   Vaginitis       Disposition: Discharged

## 2023-10-24 NOTE — ED Nurses Note (Signed)
 Patient states she has lower back pain that radiates to the front of her left side, patient has hx of gallbladder removal, states that the sensation was similar when she had gallstones.Patient denies dysuria, or difficulty urinating, nausea and vomiting.

## 2023-10-24 NOTE — Discharge Instructions (Addendum)
 Call [phone] if no answer call [phone] for out patient to get ct time. Return for any concerns .

## 2023-10-25 ENCOUNTER — Encounter (HOSPITAL_COMMUNITY): Payer: Self-pay

## 2023-10-30 ENCOUNTER — Encounter (HOSPITAL_COMMUNITY): Payer: Self-pay

## 2023-11-02 ENCOUNTER — Encounter (HOSPITAL_COMMUNITY): Payer: Self-pay

## 2023-12-17 ENCOUNTER — Other Ambulatory Visit (HOSPITAL_COMMUNITY): Payer: Self-pay

## 2023-12-17 DIAGNOSIS — Z1231 Encounter for screening mammogram for malignant neoplasm of breast: Secondary | ICD-10-CM

## 2023-12-18 ENCOUNTER — Encounter (HOSPITAL_COMMUNITY): Payer: Self-pay

## 2024-02-11 ENCOUNTER — Other Ambulatory Visit: Payer: Self-pay

## 2024-02-11 ENCOUNTER — Encounter (HOSPITAL_COMMUNITY): Payer: Self-pay

## 2024-02-11 ENCOUNTER — Ambulatory Visit
Admission: RE | Admit: 2024-02-11 | Discharge: 2024-02-11 | Disposition: A | Discharge status: Home / Self Care | Source: Ambulatory Visit

## 2024-02-11 DIAGNOSIS — Z1231 Encounter for screening mammogram for malignant neoplasm of breast: Secondary | ICD-10-CM | POA: Insufficient documentation

## 2024-05-24 ENCOUNTER — Other Ambulatory Visit: Payer: Self-pay

## 2024-05-24 ENCOUNTER — Emergency Department (HOSPITAL_BASED_OUTPATIENT_CLINIC_OR_DEPARTMENT_OTHER)

## 2024-05-24 ENCOUNTER — Emergency Department
Admission: EM | Admit: 2024-05-24 | Discharge: 2024-05-24 | Attending: Emergency Medicine | Admitting: Emergency Medicine

## 2024-05-24 ENCOUNTER — Encounter (HOSPITAL_BASED_OUTPATIENT_CLINIC_OR_DEPARTMENT_OTHER): Payer: Self-pay

## 2024-05-24 DIAGNOSIS — M546 Pain in thoracic spine: Secondary | ICD-10-CM | POA: Insufficient documentation

## 2024-05-24 DIAGNOSIS — R110 Nausea: Secondary | ICD-10-CM | POA: Insufficient documentation

## 2024-05-24 DIAGNOSIS — R0789 Other chest pain: Secondary | ICD-10-CM | POA: Insufficient documentation

## 2024-05-24 DIAGNOSIS — Z5320 Procedure and treatment not carried out because of patient's decision for unspecified reasons: Secondary | ICD-10-CM

## 2024-05-24 DIAGNOSIS — R079 Chest pain, unspecified: Secondary | ICD-10-CM

## 2024-05-24 DIAGNOSIS — R0602 Shortness of breath: Secondary | ICD-10-CM | POA: Insufficient documentation

## 2024-05-24 DIAGNOSIS — Z8249 Family history of ischemic heart disease and other diseases of the circulatory system: Secondary | ICD-10-CM | POA: Insufficient documentation

## 2024-05-24 DIAGNOSIS — Z5329 Procedure and treatment not carried out because of patient's decision for other reasons: Secondary | ICD-10-CM | POA: Insufficient documentation

## 2024-05-24 DIAGNOSIS — R9431 Abnormal electrocardiogram [ECG] [EKG]: Secondary | ICD-10-CM | POA: Insufficient documentation

## 2024-05-24 LAB — COMPREHENSIVE METABOLIC PANEL, NON-FASTING
ALBUMIN/GLOBULIN RATIO: 1 (ref 0.8–1.4)
ALBUMIN: 3.9 g/dL (ref 3.4–5.0)
ALKALINE PHOSPHATASE: 75 U/L (ref 46–116)
ALT (SGPT): 19 U/L (ref <=78)
ANION GAP: 10 mmol/L (ref 4–13)
AST (SGOT): 12 U/L — ABNORMAL LOW (ref 15–37)
BILIRUBIN TOTAL: 1 mg/dL (ref 0.2–1.0)
BUN/CREA RATIO: 12
BUN: 14 mg/dL (ref 7–18)
CALCIUM, CORRECTED: 8.9 mg/dL
CALCIUM: 8.8 mg/dL (ref 8.5–10.1)
CHLORIDE: 106 mmol/L (ref 98–107)
CO2 TOTAL: 25 mmol/L (ref 21–32)
CREATININE: 1.15 mg/dL — ABNORMAL HIGH (ref 0.55–1.02)
ESTIMATED GFR: 58 mL/min/1.73mˆ2 — ABNORMAL LOW (ref >59)
GLOBULIN: 3.9
GLUCOSE: 102 mg/dL (ref 74–106)
OSMOLALITY, CALCULATED: 282 mosm/kg (ref 270–290)
POTASSIUM: 3.4 mmol/L — ABNORMAL LOW (ref 3.5–5.1)
PROTEIN TOTAL: 7.8 g/dL (ref 6.4–8.2)
SODIUM: 141 mmol/L (ref 136–145)

## 2024-05-24 LAB — MAGNESIUM: MAGNESIUM: 2.2 mg/dL (ref 1.8–2.4)

## 2024-05-24 LAB — CBC WITH DIFF
BASOPHIL #: 0.03 x10ˆ3/uL (ref 0.00–0.10)
BASOPHIL %: 0 % (ref 0–1)
EOSINOPHIL #: 0.29 x10ˆ3/uL (ref 0.00–0.50)
EOSINOPHIL %: 4 % (ref 1–7)
HCT: 46.5 % — ABNORMAL HIGH (ref 31.2–41.9)
HGB: 16.1 g/dL — ABNORMAL HIGH (ref 10.9–14.3)
LYMPHOCYTE #: 2.93 x10ˆ3/uL (ref 1.10–3.10)
LYMPHOCYTE %: 37 % (ref 16–46)
MCH: 32.4 pg (ref 24.7–32.8)
MCHC: 34.6 g/dL (ref 32.3–35.6)
MCV: 93.8 fL (ref 75.5–95.3)
MONOCYTE #: 0.54 x10ˆ3/uL (ref 0.20–0.90)
MONOCYTE %: 7 % (ref 4–11)
MPV: 8 fL (ref 7.9–10.8)
NEUTROPHIL #: 4.08 x10ˆ3/uL (ref 1.90–8.20)
NEUTROPHIL %: 52 % (ref 43–77)
PLATELETS: 197 x10ˆ3/uL (ref 140–440)
RBC: 4.96 x10ˆ6/uL — ABNORMAL HIGH (ref 3.63–4.92)
RDW: 15.2 % (ref 12.3–17.7)
WBC: 7.9 x10ˆ3/uL (ref 3.8–11.8)

## 2024-05-24 LAB — TROPONIN-I
TROPONIN I: <2 ng/L (ref <15)
TROPONIN I: <2 ng/L (ref <15)

## 2024-05-24 LAB — PTT (PARTIAL THROMBOPLASTIN TIME): APTT: 35.2 s (ref 25.0–38.0)

## 2024-05-24 LAB — D-DIMER: D-DIMER: 442 ng{FEU}/mL (ref 215–500)

## 2024-05-24 LAB — PT/INR
INR: 0.96 (ref 0.84–1.10)
PROTHROMBIN TIME: 10.8 s (ref 9.8–12.7)

## 2024-05-24 MED ORDER — ASPIRIN 81 MG CHEWABLE TABLET
CHEWABLE_TABLET | ORAL | Status: AC
Start: 2024-05-24 — End: 2024-05-24
  Filled 2024-05-24: qty 4

## 2024-05-24 MED ORDER — ASPIRIN 81 MG CHEWABLE TABLET
324.0000 mg | CHEWABLE_TABLET | ORAL | Status: AC
Start: 2024-05-24 — End: 2024-05-24
  Administered 2024-05-24: 324 mg via ORAL

## 2024-05-24 NOTE — ED Provider Notes (Signed)
 [facility], [facility] - Emergency Department  ED Primary Provider Note  History of Present Illness   Chief Complaint   Patient presents with    Chest Pain      Pt complains of having pain in her thoracic area yesterday . Today she started having pain in the thoracic area radiating to the chest. States it feels like lightening going through her chest.     Jocelyn Le is a 53 y.o. female who had concerns including Chest Pain .  Arrival: The patient arrived by Car patient states she started having mid back pain radiating into the front of her chest and broke out in a sweat with nausea but no vomiting.  She also feels a little short of breath especially if she is exerting herself.  Patient states that she does not have any cough or increased pain with movement or deep breath.  Patient denies any abdominal pain.  Patient has never had chest pain in the past.  She denies having hypertension, diabetes, high cholesterol.  Patient does smoke occasionally.  Patient does have history of CAD in the family.  Patient presently has no pain.  She describes the pain as lightening in her back spreading out to the front of her chest.    HPI  Review of Systems   Review of Systems   Constitutional:  Positive for activity change, appetite change and diaphoresis. Negative for chills and fever.   HENT:  Negative for ear pain and sore throat.    Eyes:  Negative for pain and visual disturbance.   Respiratory:  Positive for shortness of breath. Negative for cough.    Cardiovascular:  Positive for chest pain. Negative for palpitations.   Gastrointestinal:  Positive for nausea. Negative for abdominal pain and vomiting.   Genitourinary:  Negative for dysuria and hematuria.   Musculoskeletal:  Negative for arthralgias and back pain.   Skin:  Negative for color change and rash.   Neurological:  Negative for seizures and syncope.   All other systems reviewed and are negative.     Historical Data   History Reviewed This  Encounter:     Physical Exam   ED Triage Vitals   BP (Non-Invasive) 05/24/24 1330 (!) 170/79   Heart Rate 05/24/24 1341 84   Respiratory Rate 05/24/24 1341 18   Temperature 05/24/24 1341 37.2 C (99 F)   SpO2 05/24/24 1341 98 %   Weight 05/24/24 1341 90.7 kg (200 lb)   Height 05/24/24 1341 1.676 m (5' 6)     Physical Exam  Vitals and nursing note reviewed.   Constitutional:       General: She is not in acute distress.     Appearance: Normal appearance. She is well-developed and normal weight.   HENT:      Head: Normocephalic and atraumatic.      Right Ear: External ear normal.      Left Ear: External ear normal.      Nose: Nose normal.      Mouth/Throat:      Mouth: Mucous membranes are dry.   Eyes:      Extraocular Movements: Extraocular movements intact.      Conjunctiva/sclera: Conjunctivae normal.      Pupils: Pupils are equal, round, and reactive to light.   Cardiovascular:      Rate and Rhythm: Normal rate and regular rhythm.      Pulses: Normal pulses.      Heart sounds: Normal heart sounds. No murmur  heard.  Pulmonary:      Effort: Pulmonary effort is normal. No respiratory distress.      Breath sounds: Normal breath sounds.   Abdominal:      General: Bowel sounds are normal.      Palpations: Abdomen is soft.      Tenderness: There is no abdominal tenderness.   Musculoskeletal:         General: No swelling. Normal range of motion.      Cervical back: Normal range of motion and neck supple.   Skin:     General: Skin is warm and dry.      Capillary Refill: Capillary refill takes less than 2 seconds.   Neurological:      General: No focal deficit present.      Mental Status: She is alert and oriented to person, place, and time.   Psychiatric:         Mood and Affect: Mood normal.         Behavior: Behavior normal.         Thought Content: Thought content normal.       Patient Data     Labs Ordered/Reviewed   COMPREHENSIVE METABOLIC PANEL, NON-FASTING - Abnormal; Notable for the following components:        Result Value    POTASSIUM 3.4 (*)     CREATININE 1.15 (*)     ESTIMATED GFR 58 (*)     AST (SGOT) 12 (*)     All other components within normal limits    Narrative:     Estimated Glomerular Filtration Rate (eGFR) is calculated using the CKD-EPI (2021) equation, intended for patients 68 years of age and older. If gender is not documented or unknown, there will be no eGFR calculation.   CBC WITH DIFF - Abnormal; Notable for the following components:    RBC 4.96 (*)     HGB 16.1 (*)     HCT 46.5 (*)     All other components within normal limits   PT/INR - Normal    Narrative:     In the setting of warfarin therapy, a moderate-intensity INR goal range is 2.0 to 3.0 and a high-intensity INR goal range is 2.5 to 3.5.    INR is ONLY validated to determine the level of anticoagulation with vitamin K antagonists (warfarin). Other factors may elevate the INR including but not limited to direct oral anticoagulants (DOACs), liver dysfunction, vitamin K deficiency, DIC, factor deficiencies, and factor inhibitors.   PTT (PARTIAL THROMBOPLASTIN TIME) - Normal   TROPONIN-I - Normal    Narrative:     Values received on females ranging between 12-15 ng/L MUST include the next serial troponin to review changes in the delta differences as the reference range for the Access II chemistry analyzer is lower than the established reference range.     TROPONIN-I - Normal    Narrative:     Values received on females ranging between 12-15 ng/L MUST include the next serial troponin to review changes in the delta differences as the reference range for the Access II chemistry analyzer is lower than the established reference range.     D-DIMER - Normal    Narrative:     D-Dimers are reported in FEU per ng/mL.    IF PATIENT IS EXHIBITING SYMPTOMS DVT/PE, THIS D-DIMER RESULT MAY INDICATE A NEED FOR FURTHER TESTING FOR THESE CONDITIONS. IF PATIENT IS SUSPECTED OF DIC AND SYMPTOMS WORSEN OR PERSIST, A REPEAT DIC WORKUP  SHOULD BE CONSIDERED.    NOTE:  ALTHOUGH THE NORMAL RANGE FOR THIS TEST IS 215-500 ng/mL FEU, LITERATURE RECOMMENDS FURTHER TESTING FOR ANY RESULT >500ng/mL FEU.    A cut off value of 500ng/mL FEU or below can be used as an aid in the diagnosis of Thromboembolism when used in conjunction with the patient's medical history, clinical presentation and other findings. Results of 500ng/mL FEU or below have a negative predictive value of 100%.     MAGNESIUM - Normal   CBC/DIFF    Narrative:     The following orders were created for panel order CBC/DIFF.  Procedure                               Abnormality         Status                     ---------                               -----------         ------                     CBC WITH IPQQ[[id number]]                Abnormal            Final result                 Please view results for these tests on the individual orders.   TROPONIN-I     XR AP MOBILE CHEST   Final Result by Edi, [facility] In (11/08 1348)   NO ACUTE FINDINGS.            Radiologist location ID: [id number]           Medical Decision Making        Medical Decision Making  Patient is 53 year old white female complaining having severe upper back pain radiating into her chest while she was walking at home.  She was also short of breath and broke out in a sweat.  She states she became nauseous as well.  She has never had chest pain in the past.  Patient denies any recent cough.  She denies any increased pain with movement or palpation.  She denies risk factors of hypertension, diabetes, high cholesterol.  She does smoke occasionally.  She also has family history of CAD.  She states she has never had chest pain in the past.  Patient will have an IV placed with labs and chest x-ray.  She will have serial troponins x3.  Patient will be treated for results of her testing.  She will more than likely be discharged home to follow up with her primary care physician in the next 2-3 days.    Amount and/or Complexity of Data Reviewed  Labs:  ordered.  Radiology: ordered.  ECG/medicine tests: ordered.     Details: Normal sinus rhythm 87, PR interval 152 MS, QT interval 364 MS, poor R-wave progression through the precordium, left axis deviation.    Risk  OTC drugs.             Medications Ordered/Administered in the ED   aspirin  chewable tablet 324 mg (324 mg Oral Given 05/24/24 1346)     Clinical Impression   Atypical chest pain (Primary)  Disposition: Eloped               Clinical Impression   Atypical chest pain (Primary)       There are no discharge medications for this patient.

## 2024-05-24 NOTE — ED Nurses Note (Signed)
 Patient states that for the last few days she's been having lightening bolt pain that goes down his back, but today she felt the pain go from her back to he chest and fanned out. Patient sates that the pain comes and goes.

## 2024-05-24 NOTE — ED Nurses Note (Signed)
 Patient states that she's feeling better and hasn't had any chest pains since she's been in the ER. Patient admits to some recent nausea and vomiting. Provider notified.

## 2024-05-24 NOTE — ED Nurses Note (Signed)
 Patient came to the desk and stated. My IV is laying on the bed, my labs are good, I'm leaving.  Provider notified.

## 2024-05-27 DIAGNOSIS — R9431 Abnormal electrocardiogram [ECG] [EKG]: Secondary | ICD-10-CM

## 2024-05-27 LAB — ECG 12 LEAD
Atrial Rate: 87 {beats}/min
Calculated P Axis: 35 degrees
Calculated R Axis: -12 degrees
Calculated T Axis: 40 degrees
PR Interval: 152 ms
QRS Duration: 86 ms
QT Interval: 364 ms
QTC Calculation: 438 ms
Ventricular rate: 87 {beats}/min
